# Patient Record
Sex: Male | Born: 1980 | Race: White | Hispanic: No | Marital: Married | Smoking: Never smoker
Health system: Southern US, Community
[De-identification: ages and names within clinical notes are randomized; demographics above are authoritative.]

## PROBLEM LIST (undated history)

## (undated) DIAGNOSIS — M109 Gout, unspecified: Secondary | ICD-10-CM

## (undated) DIAGNOSIS — F5104 Psychophysiologic insomnia: Secondary | ICD-10-CM

## (undated) DIAGNOSIS — E785 Hyperlipidemia, unspecified: Secondary | ICD-10-CM

## (undated) DIAGNOSIS — F329 Major depressive disorder, single episode, unspecified: Secondary | ICD-10-CM

## (undated) DIAGNOSIS — E669 Obesity, unspecified: Secondary | ICD-10-CM

## (undated) DIAGNOSIS — K219 Gastro-esophageal reflux disease without esophagitis: Secondary | ICD-10-CM

## (undated) DIAGNOSIS — I10 Essential (primary) hypertension: Secondary | ICD-10-CM

## (undated) DIAGNOSIS — R51 Headache: Secondary | ICD-10-CM

## (undated) DIAGNOSIS — F32A Depression, unspecified: Secondary | ICD-10-CM

## (undated) DIAGNOSIS — R569 Unspecified convulsions: Secondary | ICD-10-CM

## (undated) DIAGNOSIS — F419 Anxiety disorder, unspecified: Secondary | ICD-10-CM

## (undated) DIAGNOSIS — F1910 Other psychoactive substance abuse, uncomplicated: Secondary | ICD-10-CM

## (undated) HISTORY — DX: Other psychoactive substance abuse, uncomplicated: F19.10

## (undated) HISTORY — PX: FRACTURE SURGERY: SHX138

## (undated) HISTORY — DX: Obesity, unspecified: E66.9

## (undated) HISTORY — DX: Gout, unspecified: M10.9

## (undated) HISTORY — DX: Psychophysiologic insomnia: F51.04

## (undated) HISTORY — PX: OTHER SURGICAL HISTORY: SHX169

## (undated) HISTORY — DX: Hyperlipidemia, unspecified: E78.5

## (undated) HISTORY — DX: Headache: R51

---

## 2011-03-03 ENCOUNTER — Other Ambulatory Visit: Payer: Self-pay | Admitting: Orthopedic Surgery

## 2011-03-03 DIAGNOSIS — M2530 Other instability, unspecified joint: Secondary | ICD-10-CM

## 2011-03-03 DIAGNOSIS — R531 Weakness: Secondary | ICD-10-CM

## 2011-03-03 DIAGNOSIS — R52 Pain, unspecified: Secondary | ICD-10-CM

## 2011-03-11 ENCOUNTER — Ambulatory Visit
Admission: RE | Admit: 2011-03-11 | Discharge: 2011-03-11 | Disposition: A | Discharge status: Home / Self Care | Payer: 59 | Source: Ambulatory Visit | Attending: Orthopedic Surgery | Admitting: Orthopedic Surgery

## 2011-03-11 DIAGNOSIS — M2530 Other instability, unspecified joint: Secondary | ICD-10-CM

## 2011-03-11 DIAGNOSIS — R52 Pain, unspecified: Secondary | ICD-10-CM

## 2011-03-11 DIAGNOSIS — R531 Weakness: Secondary | ICD-10-CM

## 2011-03-11 MED ORDER — METHYLPREDNISOLONE ACETATE 40 MG/ML INJ SUSP (RADIOLOG: 120.0000 mg | Freq: Once | INTRAMUSCULAR | Status: DC

## 2011-03-11 MED ORDER — IOHEXOL 180 MG/ML  SOLN
15.0000 mL | Freq: Once | INTRAMUSCULAR | Status: AC | PRN
  Administered 2011-03-11: 15 mL via INTRA_ARTICULAR

## 2011-03-11 MED ORDER — IOHEXOL 180 MG/ML  SOLN: 1.0000 mL | Freq: Once | INTRAMUSCULAR | Status: AC | PRN

## 2011-03-19 ENCOUNTER — Emergency Department (HOSPITAL_COMMUNITY)
Admission: EM | Admit: 2011-03-19 | Discharge: 2011-03-19 | Disposition: A | Discharge status: Home / Self Care | Payer: 59 | Attending: Emergency Medicine | Admitting: Emergency Medicine

## 2011-03-19 ENCOUNTER — Emergency Department (HOSPITAL_COMMUNITY): Payer: 59

## 2011-03-19 DIAGNOSIS — M25519 Pain in unspecified shoulder: Secondary | ICD-10-CM | POA: Insufficient documentation

## 2011-03-19 DIAGNOSIS — IMO0002 Reserved for concepts with insufficient information to code with codable children: Secondary | ICD-10-CM | POA: Insufficient documentation

## 2011-03-19 DIAGNOSIS — G40909 Epilepsy, unspecified, not intractable, without status epilepticus: Secondary | ICD-10-CM | POA: Insufficient documentation

## 2011-03-19 DIAGNOSIS — X500XXA Overexertion from strenuous movement or load, initial encounter: Secondary | ICD-10-CM | POA: Insufficient documentation

## 2011-04-13 ENCOUNTER — Emergency Department (HOSPITAL_COMMUNITY)
Admission: EM | Admit: 2011-04-13 | Discharge: 2011-04-13 | Disposition: A | Discharge status: Home / Self Care | Payer: 59 | Attending: Emergency Medicine | Admitting: Emergency Medicine

## 2011-04-13 DIAGNOSIS — S43006A Unspecified dislocation of unspecified shoulder joint, initial encounter: Secondary | ICD-10-CM | POA: Insufficient documentation

## 2011-04-13 DIAGNOSIS — G40909 Epilepsy, unspecified, not intractable, without status epilepticus: Secondary | ICD-10-CM | POA: Insufficient documentation

## 2011-04-13 DIAGNOSIS — X58XXXA Exposure to other specified factors, initial encounter: Secondary | ICD-10-CM | POA: Insufficient documentation

## 2011-04-13 DIAGNOSIS — R000 Tachycardia, unspecified: Secondary | ICD-10-CM | POA: Insufficient documentation

## 2011-05-12 ENCOUNTER — Other Ambulatory Visit: Payer: Self-pay | Admitting: Orthopedic Surgery

## 2011-05-12 DIAGNOSIS — M25511 Pain in right shoulder: Secondary | ICD-10-CM

## 2011-05-14 ENCOUNTER — Other Ambulatory Visit: Payer: 59

## 2011-06-03 ENCOUNTER — Ambulatory Visit
Admission: RE | Admit: 2011-06-03 | Discharge: 2011-06-03 | Disposition: A | Discharge status: Home / Self Care | Payer: 59 | Source: Ambulatory Visit | Attending: Orthopedic Surgery | Admitting: Orthopedic Surgery

## 2011-06-03 DIAGNOSIS — M25511 Pain in right shoulder: Secondary | ICD-10-CM

## 2011-07-16 ENCOUNTER — Encounter (HOSPITAL_BASED_OUTPATIENT_CLINIC_OR_DEPARTMENT_OTHER): Payer: Self-pay | Admitting: *Deleted

## 2011-07-16 NOTE — Progress Notes (Signed)
Pack an overnight bag just in case he needs to spend the night for pain and bring all medications.

## 2011-07-22 ENCOUNTER — Ambulatory Visit (HOSPITAL_BASED_OUTPATIENT_CLINIC_OR_DEPARTMENT_OTHER)
Admission: RE | Admit: 2011-07-22 | Discharge: 2011-07-23 | Disposition: A | Discharge status: Home / Self Care | Payer: 59 | Source: Ambulatory Visit | Attending: Orthopedic Surgery | Admitting: Orthopedic Surgery

## 2011-07-22 ENCOUNTER — Ambulatory Visit (HOSPITAL_BASED_OUTPATIENT_CLINIC_OR_DEPARTMENT_OTHER): Payer: 59 | Admitting: Anesthesiology

## 2011-07-22 ENCOUNTER — Ambulatory Visit (HOSPITAL_COMMUNITY): Payer: 59

## 2011-07-22 ENCOUNTER — Encounter (HOSPITAL_BASED_OUTPATIENT_CLINIC_OR_DEPARTMENT_OTHER): Payer: Self-pay | Admitting: Anesthesiology

## 2011-07-22 ENCOUNTER — Encounter (HOSPITAL_BASED_OUTPATIENT_CLINIC_OR_DEPARTMENT_OTHER)
Admission: RE | Disposition: A | Discharge status: Home / Self Care | Payer: Self-pay | Source: Ambulatory Visit | Attending: Orthopedic Surgery

## 2011-07-22 ENCOUNTER — Encounter (HOSPITAL_BASED_OUTPATIENT_CLINIC_OR_DEPARTMENT_OTHER): Payer: Self-pay

## 2011-07-22 DIAGNOSIS — S43499A Other sprain of unspecified shoulder joint, initial encounter: Secondary | ICD-10-CM

## 2011-07-22 DIAGNOSIS — Z472 Encounter for removal of internal fixation device: Secondary | ICD-10-CM | POA: Insufficient documentation

## 2011-07-22 DIAGNOSIS — M01X19 Direct infection of unspecified shoulder in infectious and parasitic diseases classified elsewhere: Secondary | ICD-10-CM | POA: Insufficient documentation

## 2011-07-22 HISTORY — DX: Unspecified convulsions: R56.9

## 2011-07-22 HISTORY — PX: BANKART REPAIR: SHX5173

## 2011-07-22 HISTORY — PX: HARDWARE REMOVAL: SHX979

## 2011-07-22 HISTORY — DX: Anxiety disorder, unspecified: F41.9

## 2011-07-22 HISTORY — DX: Major depressive disorder, single episode, unspecified: F32.9

## 2011-07-22 HISTORY — DX: Depression, unspecified: F32.A

## 2011-07-22 LAB — POCT HEMOGLOBIN-HEMACUE: Hemoglobin: 17 g/dL (ref 13.0–17.0)

## 2011-07-22 SURGERY — REPAIR, SHOULDER, OPEN, BANKART
Anesthesia: Regional | Site: Shoulder | Laterality: Right | Wound class: Clean

## 2011-07-22 MED ORDER — CLINDAMYCIN PHOSPHATE 600 MG/50ML IV SOLN
600.0000 mg | Freq: Four times a day (QID) | INTRAVENOUS | Status: DC
  Administered 2011-07-22 – 2011-07-23 (×2): 600 mg via INTRAVENOUS

## 2011-07-22 MED ORDER — METOCLOPRAMIDE HCL 5 MG PO TABS: 5.0000 mg | ORAL_TABLET | Freq: Three times a day (TID) | ORAL | Status: DC | PRN

## 2011-07-22 MED ORDER — METHOCARBAMOL 500 MG PO TABS
500.0000 mg | ORAL_TABLET | Freq: Four times a day (QID) | ORAL | Status: DC | PRN
  Administered 2011-07-22 – 2011-07-23 (×2): 500 mg via ORAL

## 2011-07-22 MED ORDER — HYDROMORPHONE HCL PF 1 MG/ML IJ SOLN
0.2500 mg | INTRAMUSCULAR | Status: DC | PRN
  Administered 2011-07-22 (×4): 0.5 mg via INTRAVENOUS

## 2011-07-22 MED ORDER — METOCLOPRAMIDE HCL 5 MG/ML IJ SOLN: 5.0000 mg | Freq: Three times a day (TID) | INTRAMUSCULAR | Status: DC | PRN

## 2011-07-22 MED ORDER — MORPHINE SULFATE 2 MG/ML IJ SOLN
2.0000 mg | INTRAMUSCULAR | Status: DC | PRN
  Administered 2011-07-22 – 2011-07-23 (×2): 2 mg via INTRAVENOUS

## 2011-07-22 MED ORDER — PROPOFOL 10 MG/ML IV BOLUS
INTRAVENOUS | Status: DC | PRN
  Administered 2011-07-22: 200 mg via INTRAVENOUS

## 2011-07-22 MED ORDER — FENTANYL CITRATE 0.05 MG/ML IJ SOLN
INTRAMUSCULAR | Status: DC | PRN
  Administered 2011-07-22: 50 ug via INTRAVENOUS
  Administered 2011-07-22: 100 ug via INTRAVENOUS
  Administered 2011-07-22 (×4): 25 ug via INTRAVENOUS
  Administered 2011-07-22: 50 ug via INTRAVENOUS

## 2011-07-22 MED ORDER — MENTHOL 3 MG MT LOZG: 1.0000 | LOZENGE | OROMUCOSAL | Status: DC | PRN

## 2011-07-22 MED ORDER — MEPERIDINE HCL 25 MG/ML IJ SOLN: 6.2500 mg | INTRAMUSCULAR | Status: DC | PRN

## 2011-07-22 MED ORDER — MIDAZOLAM HCL 2 MG/2ML IJ SOLN
1.0000 mg | INTRAMUSCULAR | Status: DC | PRN
  Administered 2011-07-22: 2 mg via INTRAVENOUS

## 2011-07-22 MED ORDER — ONDANSETRON HCL 4 MG PO TABS: 4.0000 mg | ORAL_TABLET | Freq: Four times a day (QID) | ORAL | Status: DC | PRN

## 2011-07-22 MED ORDER — PHENOL 1.4 % MT LIQD: 1.0000 | OROMUCOSAL | Status: DC | PRN

## 2011-07-22 MED ORDER — METHOCARBAMOL 100 MG/ML IJ SOLN: 500.0000 mg | Freq: Four times a day (QID) | INTRAVENOUS | Status: DC | PRN

## 2011-07-22 MED ORDER — DOCUSATE SODIUM 100 MG PO CAPS: 100.0000 mg | ORAL_CAPSULE | Freq: Two times a day (BID) | ORAL | Status: DC

## 2011-07-22 MED ORDER — ONDANSETRON HCL 4 MG/2ML IJ SOLN: 4.0000 mg | Freq: Four times a day (QID) | INTRAMUSCULAR | Status: DC | PRN

## 2011-07-22 MED ORDER — DROPERIDOL 2.5 MG/ML IJ SOLN
INTRAMUSCULAR | Status: DC | PRN
  Administered 2011-07-22: 0.625 mg via INTRAVENOUS

## 2011-07-22 MED ORDER — ACETAMINOPHEN 650 MG RE SUPP: 650.0000 mg | Freq: Four times a day (QID) | RECTAL | Status: DC | PRN

## 2011-07-22 MED ORDER — ONDANSETRON HCL 4 MG/2ML IJ SOLN: 4.0000 mg | Freq: Once | INTRAMUSCULAR | Status: AC | PRN

## 2011-07-22 MED ORDER — FENTANYL CITRATE 0.05 MG/ML IJ SOLN
50.0000 ug | INTRAMUSCULAR | Status: DC | PRN
  Administered 2011-07-22: 100 ug via INTRAVENOUS

## 2011-07-22 MED ORDER — ZOLPIDEM TARTRATE 10 MG PO TABS
10.0000 mg | ORAL_TABLET | Freq: Every evening | ORAL | Status: DC | PRN
  Administered 2011-07-22: 10 mg via ORAL

## 2011-07-22 MED ORDER — LAMOTRIGINE 200 MG PO TABS: 200.0000 mg | ORAL_TABLET | Freq: Every day | ORAL | Status: DC

## 2011-07-22 MED ORDER — MIDAZOLAM HCL 5 MG/5ML IJ SOLN
INTRAMUSCULAR | Status: DC | PRN
  Administered 2011-07-22: 2 mg via INTRAVENOUS

## 2011-07-22 MED ORDER — DIPHENHYDRAMINE HCL 12.5 MG/5ML PO ELIX: 12.5000 mg | ORAL_SOLUTION | ORAL | Status: DC | PRN

## 2011-07-22 MED ORDER — CLINDAMYCIN PHOSPHATE 600 MG/50ML IV SOLN
INTRAVENOUS | Status: DC | PRN
  Administered 2011-07-22: 900 mg via INTRAVENOUS

## 2011-07-22 MED ORDER — DEXAMETHASONE SODIUM PHOSPHATE 10 MG/ML IJ SOLN
INTRAMUSCULAR | Status: DC | PRN
  Administered 2011-07-22: 10 mg via INTRAVENOUS

## 2011-07-22 MED ORDER — ROCURONIUM BROMIDE 100 MG/10ML IV SOLN
INTRAVENOUS | Status: DC | PRN
  Administered 2011-07-22: 40 mg via INTRAVENOUS

## 2011-07-22 MED ORDER — KCL IN DEXTROSE-NACL 20-5-0.45 MEQ/L-%-% IV SOLN
INTRAVENOUS | Status: DC
  Administered 2011-07-22 – 2011-07-23 (×4): via INTRAVENOUS

## 2011-07-22 MED ORDER — FLUOXETINE HCL 20 MG PO CAPS: 20.0000 mg | ORAL_CAPSULE | Freq: Every day | ORAL | Status: DC

## 2011-07-22 MED ORDER — LACTATED RINGERS IV SOLN
INTRAVENOUS | Status: DC
  Administered 2011-07-22 (×2): via INTRAVENOUS

## 2011-07-22 MED ORDER — MORPHINE SULFATE 2 MG/ML IJ SOLN: 0.0500 mg/kg | INTRAMUSCULAR | Status: DC | PRN

## 2011-07-22 MED ORDER — LIDOCAINE HCL (CARDIAC) 20 MG/ML IV SOLN
INTRAVENOUS | Status: DC | PRN
  Administered 2011-07-22: 75 mg via INTRAVENOUS

## 2011-07-22 MED ORDER — ONDANSETRON HCL 4 MG/2ML IJ SOLN
INTRAMUSCULAR | Status: DC | PRN
  Administered 2011-07-22 (×2): 4 mg via INTRAVENOUS

## 2011-07-22 MED ORDER — ACETAMINOPHEN 325 MG PO TABS: 650.0000 mg | ORAL_TABLET | Freq: Four times a day (QID) | ORAL | Status: DC | PRN

## 2011-07-22 MED ORDER — OXYCODONE-ACETAMINOPHEN 5-325 MG PO TABS
1.0000 | ORAL_TABLET | ORAL | Status: DC | PRN
  Administered 2011-07-22 – 2011-07-23 (×4): 2 via ORAL

## 2011-07-22 SURGICAL SUPPLY — 104 items
ANCHOR SUTURETAK 2.4X12.4 (Orthopedic Implant) ×6 IMPLANT
BANDAGE ELASTIC 4 VELCRO ST LF (GAUZE/BANDAGES/DRESSINGS) IMPLANT
BANDAGE ELASTIC 6 VELCRO ST LF (GAUZE/BANDAGES/DRESSINGS) IMPLANT
BANDAGE ESMARK 6X9 LF (GAUZE/BANDAGES/DRESSINGS) IMPLANT
BENZOIN TINCTURE PRP APPL 2/3 (GAUZE/BANDAGES/DRESSINGS) IMPLANT
BLADE AVERAGE 25X9 (BLADE) ×2 IMPLANT
BLADE SURG 15 STRL LF DISP TIS (BLADE) ×2 IMPLANT
BLADE SURG 15 STRL SS (BLADE) ×2
BLADE SURG ROTATE 9660 (MISCELLANEOUS) IMPLANT
BNDG ESMARK 4X9 LF (GAUZE/BANDAGES/DRESSINGS) IMPLANT
BNDG ESMARK 6X9 LF (GAUZE/BANDAGES/DRESSINGS)
CANISTER OMNI JUG 16 LITER (MISCELLANEOUS) ×2 IMPLANT
CANISTER SUCTION 1200CC (MISCELLANEOUS) ×2 IMPLANT
CANISTER SUCTION 2500CC (MISCELLANEOUS) ×2 IMPLANT
CHLORAPREP W/TINT 26ML (MISCELLANEOUS) ×2 IMPLANT
CLOTH BEACON ORANGE TIMEOUT ST (SAFETY) ×2 IMPLANT
COVER TABLE BACK 60X90 (DRAPES) IMPLANT
DECANTER SPIKE VIAL GLASS SM (MISCELLANEOUS) IMPLANT
DISPOSABLES KIT, FOR 2.4MM BIO-SUTURE TAK ×2 IMPLANT
DRAPE C-ARM 42X72 X-RAY (DRAPES) ×2 IMPLANT
DRAPE EXTREMITY T 121X128X90 (DRAPE) IMPLANT
DRAPE INCISE IOBAN 66X45 STRL (DRAPES) ×2 IMPLANT
DRAPE OEC MINIVIEW 54X84 (DRAPES) IMPLANT
DRAPE U 20/CS (DRAPES) ×2 IMPLANT
DRAPE U-SHAPE 47X51 STRL (DRAPES) ×4 IMPLANT
DRAPE U-SHAPE 76X120 STRL (DRAPES) ×4 IMPLANT
DRSG EMULSION OIL 3X3 NADH (GAUZE/BANDAGES/DRESSINGS) ×2 IMPLANT
DRSG PAD ABDOMINAL 8X10 ST (GAUZE/BANDAGES/DRESSINGS) ×4 IMPLANT
ELECT BLADE 6.5 .24CM SHAFT (ELECTRODE) IMPLANT
ELECT REM PT RETURN 9FT ADLT (ELECTROSURGICAL) ×2
ELECTRODE REM PT RTRN 9FT ADLT (ELECTROSURGICAL) ×1 IMPLANT
GAUZE SPONGE 4X4 16PLY XRAY LF (GAUZE/BANDAGES/DRESSINGS) IMPLANT
GLOVE BIO SURGEON STRL SZ 6.5 (GLOVE) ×4 IMPLANT
GLOVE BIO SURGEON STRL SZ7.5 (GLOVE) ×6 IMPLANT
GLOVE BIOGEL PI IND STRL 7.0 (GLOVE) ×2 IMPLANT
GLOVE BIOGEL PI IND STRL 8 (GLOVE) ×2 IMPLANT
GLOVE BIOGEL PI INDICATOR 7.0 (GLOVE) ×2
GLOVE BIOGEL PI INDICATOR 8 (GLOVE) ×2
GLOVE ECLIPSE 7.0 STRL STRAW (GLOVE) ×2 IMPLANT
GLOVE ECLIPSE 7.5 STRL STRAW (GLOVE) ×4 IMPLANT
GOWN PREVENTION PLUS XLARGE (GOWN DISPOSABLE) ×12 IMPLANT
GOWN PREVENTION PLUS XXLARGE (GOWN DISPOSABLE) ×2 IMPLANT
GUIDEWIRE ORTHO 0.054X7 SS (WIRE) ×2 IMPLANT
KIT BIO-TENODESIS 3X8 DISP (MISCELLANEOUS)
KIT INSRT BABSR STRL DISP BTN (MISCELLANEOUS) IMPLANT
NEEDLE HYPO 22GX1.5 SAFETY (NEEDLE) IMPLANT
NEEDLE MAYO 6 CRC TAPER PT (NEEDLE) ×2 IMPLANT
NS IRRIG 1000ML POUR BTL (IV SOLUTION) ×4 IMPLANT
PACK ARTHROSCOPY DSU (CUSTOM PROCEDURE TRAY) ×2 IMPLANT
PACK BASIN DAY SURGERY FS (CUSTOM PROCEDURE TRAY) ×2 IMPLANT
PAD CAST 4YDX4 CTTN HI CHSV (CAST SUPPLIES) IMPLANT
PADDING CAST ABS 4INX4YD NS (CAST SUPPLIES) ×1
PADDING CAST ABS COTTON 4X4 ST (CAST SUPPLIES) ×1 IMPLANT
PADDING CAST COTTON 4X4 STRL (CAST SUPPLIES)
PADDING CAST COTTON 6X4 STRL (CAST SUPPLIES) IMPLANT
PASSER SUT SWANSON 36MM LOOP (INSTRUMENTS) IMPLANT
PENCIL BUTTON HOLSTER BLD 10FT (ELECTRODE) ×2 IMPLANT
SCREW ACUTRAK 2 STD 30MM (Screw) ×4 IMPLANT
SHEET MEDIUM DRAPE 40X70 STRL (DRAPES) ×2 IMPLANT
SLEEVE SCD COMPRESS KNEE MED (MISCELLANEOUS) ×2 IMPLANT
SLING ARM FOAM STRAP LRG (SOFTGOODS) IMPLANT
SLING ARM FOAM STRAP MED (SOFTGOODS) IMPLANT
SLING ARM FOAM STRAP XLG (SOFTGOODS) IMPLANT
SLING ARM IMMOBILIZER LRG (SOFTGOODS) ×2 IMPLANT
SLING ARM IMMOBILIZER MED (SOFTGOODS) IMPLANT
SPLINT FAST PLASTER 5X30 (CAST SUPPLIES)
SPLINT PLASTER CAST FAST 5X30 (CAST SUPPLIES) IMPLANT
SPONGE GAUZE 4X4 12PLY (GAUZE/BANDAGES/DRESSINGS) ×2 IMPLANT
SPONGE LAP 18X18 X RAY DECT (DISPOSABLE) ×4 IMPLANT
SPONGE LAP 4X18 X RAY DECT (DISPOSABLE) ×2 IMPLANT
STAPLER VISISTAT (STAPLE) IMPLANT
STAPLER VISISTAT 35W (STAPLE) ×2 IMPLANT
STOCKINETTE 6  STRL (DRAPES)
STOCKINETTE 6 STRL (DRAPES) IMPLANT
STRIP CLOSURE SKIN 1/2X4 (GAUZE/BANDAGES/DRESSINGS) ×2 IMPLANT
STRYKER PERFORMANCE SAGITTAL BLADE ×2 IMPLANT
SUCTION FRAZIER TIP 10 FR DISP (SUCTIONS) ×2 IMPLANT
SUT 2 FIBERLOOP 20 STRT BLUE (SUTURE)
SUT ETHIBOND 2 OS 4 DA (SUTURE) ×2 IMPLANT
SUT ETHILON 4 0 PS 2 18 (SUTURE) IMPLANT
SUT FIBERWIRE #2 38 T-5 BLUE (SUTURE) ×2
SUT MNCRL AB 3-0 PS2 18 (SUTURE) IMPLANT
SUT MNCRL AB 4-0 PS2 18 (SUTURE) ×2 IMPLANT
SUT MON AB 4-0 PC3 18 (SUTURE) IMPLANT
SUT PDS AB 0 CT 36 (SUTURE) IMPLANT
SUT PROLENE 3 0 PS 2 (SUTURE) IMPLANT
SUT VIC AB 0 CT1 18XCR BRD 8 (SUTURE) ×1 IMPLANT
SUT VIC AB 0 CT1 8-18 (SUTURE) ×1
SUT VIC AB 2-0 SH 18 (SUTURE) ×4 IMPLANT
SUT VIC AB 2-0 SH 27 (SUTURE) ×1
SUT VIC AB 2-0 SH 27XBRD (SUTURE) ×1 IMPLANT
SUT VIC AB 3-0 FS2 27 (SUTURE) IMPLANT
SUT VICRYL 4-0 PS2 18IN ABS (SUTURE) ×2 IMPLANT
SUTURE 2 FIBERLOOP 20 STRT BLU (SUTURE) IMPLANT
SUTURE FIBERWR #2 38 T-5 BLUE (SUTURE) ×1 IMPLANT
SYR 20CC LL (SYRINGE) IMPLANT
SYR BULB 3OZ (MISCELLANEOUS) ×2 IMPLANT
TISSUE GRFT HUMERAL HD TO 43MM (Tissue) ×2 IMPLANT
TOWEL OR 17X24 6PK STRL BLUE (TOWEL DISPOSABLE) ×2 IMPLANT
TOWEL OR NON WOVEN STRL DISP B (DISPOSABLE) ×2 IMPLANT
TUBE CONNECTING 20X1/4 (TUBING) ×2 IMPLANT
UNDERPAD 30X30 INCONTINENT (UNDERPADS AND DIAPERS) ×2 IMPLANT
WATER STERILE IRR 1000ML POUR (IV SOLUTION) ×2 IMPLANT
YANKAUER SUCT BULB TIP NO VENT (SUCTIONS) ×2 IMPLANT

## 2011-07-22 NOTE — Anesthesia Procedure Notes (Addendum)
Anesthesia Regional Block:  Interscalene brachial plexus block  Pre-Anesthetic Checklist: ,, timeout performed, Correct Patient, Correct Site, Correct Laterality, Correct Procedure,, site marked, risks and benefits discussed, Surgical consent,  Pre-op evaluation,  At surgeon's request and post-op pain management  Laterality: Right  Prep: chloraprep       Needles:   Needle Type: Echogenic Stimulator Needle     Needle Length: 5cm 5 cm     Additional Needles:  Procedures: ultrasound guided and nerve stimulator Interscalene brachial plexus block  Nerve Stimulator or Paresthesia:  Response: 0.4 mA,   Additional Responses:   Narrative:  Start time: 07/22/2011 12:05 PM End time: 07/22/2011 12:25 PM Injection made incrementally with aspirations every 5 mL. Anesthesiologist: Arta Bruce MD  Additional Notes: Monitors applied. Patient sedated. Sterile prep and drape,hand hygiene and sterile gloves were used. Relevant anatomy identified.Needle position confirmed.Local anesthetic injected incrementally after negative aspiration. Local anesthetic spread visualized around nerve(s). Vascular puncture avoided. No complications. Image printed for medical record.The patient tolerated the procedure well.        Procedure Name: Intubation Date/Time: 07/22/2011 1:26 PM Performed by: Zenia Resides D Pre-anesthesia Checklist: Patient identified, Emergency Drugs available, Suction available, Patient being monitored and Timeout performed Patient Re-evaluated:Patient Re-evaluated prior to inductionOxygen Delivery Method: Circle System Utilized Preoxygenation: Pre-oxygenation with 100% oxygen Intubation Type: IV induction Ventilation: Mask ventilation without difficulty Laryngoscope Size: Mac and 3 Grade View: Grade I Tube type: Oral Tube size: 8.0 mm Number of attempts: 1 Airway Equipment and Method: stylet and oral airway Placement Confirmation: ETT inserted through vocal cords under direct  vision,  positive ETCO2 and breath sounds checked- equal and bilateral Tube secured with: Tape Dental Injury: Teeth and Oropharynx as per pre-operative assessment

## 2011-07-22 NOTE — Transfer of Care (Signed)
Immediate Anesthesia Transfer of Care Note  Patient: Derek Mckinney  Procedure(s) Performed:  OPEN BANKHARDT REPAIR - right shoulder hardware removal, bone grafting of humeral head defect, open bankhardt repair C ARM; HARDWARE REMOVAL  Patient Location: PACU  Anesthesia Type: General and Regional  Level of Consciousness: awake, alert  and oriented  Airway & Oxygen Therapy: Patient Spontanous Breathing and Patient connected to face mask oxygen  Post-op Assessment: Report given to PACU RN and Post -op Vital signs reviewed and stable  Post vital signs: Reviewed and stable Filed Vitals:   07/22/11 1015  BP: 142/92  Pulse: 84  Temp: 36.6 C  Resp: 20    Complications: No apparent anesthesia complications

## 2011-07-22 NOTE — Anesthesia Postprocedure Evaluation (Signed)
Anesthesia Post Note  Patient: Derek Mckinney  Procedure(s) Performed:  OPEN BANKHARDT REPAIR - right shoulder hardware removal, bone grafting of humeral head defect, open bankhardt repair C ARM; HARDWARE REMOVAL  Anesthesia type: General  Patient location: PACU  Post pain: Pain level controlled  Post assessment: Patient's Cardiovascular Status Stable  Last Vitals:  Filed Vitals:   07/22/11 1745  BP: 144/77  Pulse: 108  Temp:   Resp: 18    Post vital signs: Reviewed and stable  Level of consciousness: alert  Complications: No apparent anesthesia complications

## 2011-07-22 NOTE — Anesthesia Preprocedure Evaluation (Signed)
Anesthesia Evaluation  Patient identified by MRN, date of birth, ID band Patient awake    Reviewed: Allergy & Precautions, H&P , NPO status , Patient's Chart, lab work & pertinent test results  Airway Mallampati: I TM Distance: >3 FB Neck ROM: full    Dental   Pulmonary          Cardiovascular     Neuro/Psych    GI/Hepatic   Endo/Other    Renal/GU      Musculoskeletal   Abdominal   Peds  Hematology   Anesthesia Other Findings   Reproductive/Obstetrics                           Anesthesia Physical Anesthesia Plan  ASA: II  Anesthesia Plan: General ETT and Regional   Post-op Pain Management: MAC Combined w/ Regional for Post-op pain   Induction:   Airway Management Planned:   Additional Equipment:   Intra-op Plan:   Post-operative Plan:   Informed Consent: I have reviewed the patients History and Physical, chart, labs and discussed the procedure including the risks, benefits and alternatives for the proposed anesthesia with the patient or authorized representative who has indicated his/her understanding and acceptance.     Plan Discussed with: CRNA and Surgeon  Anesthesia Plan Comments:         Anesthesia Quick Evaluation

## 2011-07-22 NOTE — Brief Op Note (Signed)
07/22/2011  4:59 PM  PATIENT:  Noreene Larsson  31 y.o. 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  POST-OPERATIVE DIAGNOSIS:  same as preop  PROCEDURE:  Procedure(s):  R shoulder: Open allograft posterior humeral head replacement OPEN [employer] REPAIR HARDWARE REMOVAL  SURGEON:  Surgeon(s): [name], MD Eulas Post  PHYSICIAN ASSISTANT: Jiles Harold PA-S  ASSISTANTS:  Teryl Lucy MD  ANESTHESIA:   regional and general  EBL:  Total I/O In: 2050 [I.V.:2050] Out: 225 [Blood:225]  BLOOD ADMINISTERED:none  DRAINS: none   LOCAL MEDICATIONS USED:  NONE  SPECIMEN:  No Specimen  DISPOSITION OF SPECIMEN:  N/A  COUNTS:  YES  TOURNIQUET:  * No tourniquets in log *  DICTATION: .Other Dictation: Dictation Number [id number]  PLAN OF CARE: Admit for overnight observation  PATIENT DISPOSITION:  PACU - hemodynamically stable.   Delay start of Pharmacological VTE agent (>24hrs) due to surgical blood loss or risk of bleeding:  {YES/NO/NOT APPLICABLE:20182

## 2011-07-22 NOTE — Progress Notes (Signed)
Assisted Dr. Ossey with right, ultrasound guided, supraclavicular block. Side rails up, monitors on throughout procedure. See vital signs in flow sheet. Tolerated Procedure well. 

## 2011-07-22 NOTE — H&P (Signed)
Derek Mckinney is an 31 y.o. male.   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  Past Medical History  Diagnosis Date  . Seizures   . Depression   . Anxiety     Past Surgical History  Procedure Date  . Right shoulder surgery     2012  . Widom teeth extraction      as teenager    History reviewed. No pertinent family history. Social History:  reports that he has never smoked. He does not have any smokeless tobacco history on file. He reports that he does not drink alcohol or use illicit drugs.  Allergies:  Allergies  Allergen Reactions  . Penicillins Swelling    No current facility-administered medications on file as of 07/22/2011.   Medications Prior to Admission  Medication Sig Dispense Refill  . FLUoxetine (PROZAC) 20 MG capsule Take 20 mg by mouth daily.      Marland Kitchen HYDROcodone-acetaminophen (NORCO) 7.5-325 MG per tablet Take 1 tablet by mouth 3 (three) times daily.      Marland Kitchen lamoTRIgine (LAMICTAL) 200 MG tablet Take 200 mg by mouth daily.      . Multiple Vitamin (MULTIVITAMIN) tablet Take 1 tablet by mouth daily.      Marland Kitchen zolpidem (AMBIEN) 10 MG tablet Take 10 mg by mouth at bedtime as needed.        No results found for this or any previous visit (from the past 48 hour(s)). No results found.  Review of Systems  All other systems reviewed and are negative.    Height 6\' 2"  (1.88 m), weight 122.471 kg (270 lb). Physical Exam  Constitutional: He is oriented to person, place, and time. He appears well-developed and well-nourished.  HENT:  Head: Atraumatic.  Eyes: EOM are normal.  Cardiovascular: Intact distal pulses.   Respiratory: Effort normal.  Musculoskeletal:       Right shoulder: He exhibits decreased range of motion and pain. He exhibits normal pulse.  Neurological: He is alert and oriented to person, place, and time.  Skin: Skin is warm  and dry.  Psychiatric: He has a normal mood and affect.     Assessment/Plan R shoulder pain instability after open reduction.  Plan for plate removal, bankart repair and allograft for hill sachs defect. Risks / benefits of surgery discussed Consent on chart  NPO for OR Preop antibiotics   Derek Mckinney WILLIAM 07/22/2011, 10:02 AM

## 2011-07-23 MED ORDER — OXYCODONE-ACETAMINOPHEN 5-325 MG PO TABS: 1.0000 | ORAL_TABLET | ORAL | Status: AC | PRN

## 2011-07-23 NOTE — Op Note (Signed)
NAMEJAMES, SENN               ACCOUNT NO.:  [id number]  MEDICAL RECORD NO.:  [id number]  LOCATION:                                 FACILITY:  PHYSICIAN:  Jones Broom, MD    DATE OF BIRTH:  01-12-1981  DATE OF PROCEDURE:  07/22/2011 DATE OF DISCHARGE:                              OPERATIVE REPORT   PREOPERATIVE DIAGNOSES: 1. 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 2. Right shoulder Bankart labral tear. 3. Retained locking plate, right shoulder.  POSTOPERATIVE DIAGNOSES: 1. 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 2. Right shoulder Bankart labral tear. 3. Retained locking plate, right shoulder.  PROCEDURE PERFORMED: 1. Right shoulder allograft replacement of posterior humeral head Hill-     Sachs defect. 2. Right shoulder open Bankart repair. 3. Right shoulder, removal of locking plate.  ATTENDING SURGEON:  Jones Broom, MD  ASSISTANT:  Eulas Post, MD (Dr. Shelba Flake assistance was imperative for exposure and positioning during this procedure.  PHYSICIAN ASSISTANT:  Jiles Harold, PA-S.  ANESTHESIA:  GETA with preoperative interscalene block.  COMPLICATIONS:  None.  DRAINS:  None.  SPECIMENS:  None.  ESTIMATED BLOOD LOSS:  200 mL.  INDICATION FOR SURGERY:  The patient is a 31 year old gentleman who had a seizure approximately 1 year ago and suffered a right shoulder fracture dislocation.  It was apparently missed for about a month and then he went on to have open reduction and internal fixation.  He has gone on to have chronic pain and instability in the shoulder.  He is unable to keep the shoulder in the socket, it frequently goes out including in his sleep.  He had x-rays and CT scan which demonstrated a very large Hill-Sachs deformity and an MRI which showed the rotator cuff to be intact, but an anterior inferior labral tear.  Given the large Hill-Sachs defect and no associated glenoid  bone loss, he was indicated for open bone grafting of the Hill-Sachs defect as well as an open Bankart repair and removal of the plate and screws.  He understood risks, benefits, and alternatives of the procedure including, but not limited to risk of bleeding, infection, damage to neurovascular structures, including stretch injury.  He understood the inherent risks of allograft use.  He understood the potential risk of recurrent instability.  He elected to go forward with surgery.  OPERATIVE FINDINGS:  His previous incision was used.  The widened scar was excised.  The plate and all screws were removed.  The subscapularis was taken down with a tenotomy.  The tendon was noted to be very attenuated.  The Hill-Sachs was exposed and an allograft humeral head which was matched in size was used to replace the posterior humeral head defect.  Two Acutrak screws were used to fix this in place.  An open Bankart procedure was carried out with three 2.4-mm PEEK suture tack anchors.  The shoulder was stable at the conclusion of the procedure, and I can bring him out to about 15 degrees external rotation without undue stress on the subscapularis repair.  DESCRIPTION OF PROCEDURE:  The patient was identified in the preoperative holding area where I personally marked the operative  site after verifying site, side, and procedure with the patient.  He had an interscalene block given by the attending anesthesiologist and was then taken back to the operating room where general anesthesia was induced without complication.  He was placed in a beach-chair position with the back elevated about 45 degrees.  Head, neck, and opposite extremity were carefully padded in position.  After the appropriate time-out procedure, the patient did receive appropriate antibiotics.  The right upper extremity was prepped and draped in a standard sterile fashion, and the previous incision was ellipsed out as it had widened  significantly. Dissection was then carried down to the cephalic vein, which was taken laterally with the deltoid.  There was significant amount of deltoid scarring down to the lateral humerus and plate which was carefully meticulously excised, taking care to protect the axillary nerve.  Once the subdeltoid space was completely opened up, the pectoralis major was identified.  The conjoined tendon was identified and carefully protected.  The lateral border of the conjoined was developed and a retractor was placed beneath the conjoined tendon with the lateral portal retractor between the deltoid laterally.  The plate and all screws were then successfully removed.  Attention was then turned to the subscapularis where the tendon was taken down with a tenotomy approximately 1 cm medial to the bicipital groove.  The subscapularis was tagged with an Ethibond suture for later retrieval.  The capsule was taken off the neck of the humerus around to about 6 o'clock for about 1 cm capsular release down on to the proximal humerus for complete exposure that would be required for the Hill-Sachs defect.  With adduction, external rotation, and extension, the humeral head was then dislocated and progressively externally rotated until the forearm was nearly pointing completely backwards to allow adequate exposure of the Hill-Sachs defect.  With progressive external rotation, with patience, I was able to fully expose the Hill-Sachs defect.  An oscillating saw was used to freshen the anterior edge of the defect to create a nice flat surface.  Measurements were then taken of the depth and width of the defect for preparation of the allograft.  Then, on the back table, the allograft was measured and marked and a small oscillating saw was used to cut the appropriate size allograft on the matched allograft humeral head which was thawed.  It was then placed in the defect on the posterior humeral head, and after a  few minor adjustments, was a perfect fit.  The allograft was pinned in place with guide pins from the Acutrak screw set separated by about 2 cm evenly spaced.  They were then drilled and filled with 230-mm Acutrak screws with excellent fixation.  Each screw was countersunk by approximately 1-2 mm.  The guide pins were removed and the joint was reduced.  Fluoroscopic imaging was used to verify appropriate positioning, size of the graft, and positioning and size of the screws.  The joint was then copiously irrigated with normal saline.  The arm was then placed in a sterile arm holder in slight flexion and a Fukuda retractor was placed posteriorly, taking great care not to disrupt the graft.  The anterior glenoid was exposed and noted to have a Bankart tear from approximately 2 o'clock to 6 o'clock position anteriorly.  A Cobb elevator was used to gently elevate the torn labrum. A rongeur was used on the anterior glenoid to debride excess scar tissue.  A small rasp was used to roughen the area to promote  healing. Three 2.4-mm PEEK anchors were then placed under direct visualization with one at the 5 o'clock, 4 o'clock, and 3 o'clock position.  Each suture strand was then sequentially passed inferior to superior, advancing the labrum and anterior-inferior capsule superiorly with each pass.  Using a knot pusher, each was then tied and cut.  The labral repair was felt to be excellent.  The joint was again copiously irrigated with normal saline, and the subscapularis was then repaired tendon to tendon with #2 FiberWire in a figure-of-eight fashion with 3 sutures.  An adequate repair was noted.  The tissue was noted to be thin, but held suture nicely and the repair felt pretty good at the end. One interval closure stitch was made as well with a figure-of-eight suture at the corner of the interval to backup the repair.  The wound was then again copiously irrigated with normal saline and  subsequently closed in layers with 2-0 Vicryl in deep dermal layer, and staples for skin closure.  The operative field was dry and drain was not felt necessary.  Sterile dressings were then applied including Adaptic, 4x4s, ABDs, and tape.  The patient was placed in a sling immobilizer, allowed to awaken from general anesthesia, transferred to the stretcher, and taken to the recovery room in stable condition.  POSTOPERATIVE PLAN:  He will be kept overnight for pain control and antibiotics.  He will be discharged in the morning.  He will remain in a sling until his followup in approximately 2 weeks for wound check and staple removal.  We will get x-rays at that time.     Jones Broom, MD     JC/MEDQ  D:  07/22/2011  T:  07/23/2011  Job:  [id number]

## 2011-07-23 NOTE — Progress Notes (Signed)
PATIENT ID: Derek Mckinney  MRN: [id number]  DOB/AGE:  [birth year] / 31 y.o.  1 Day Post-Op Procedure(s) (LRB): OPEN Derek Mckinney REPAIR (Right) HARDWARE REMOVAL (Right)  Subjective: Pain is moderate and improving.  No c/o chest pain or SOB.   Well controlled with percocet.   Objective: Vital signs in last 24 hours: Temp:  [97.6 F (36.4 C)-98.7 F (37.1 C)] 98.5 F (36.9 C) (01/15 2045) Pulse Rate:  [84-115] 100  (01/16 0500) Resp:  [16-20] 16  (01/16 0500) BP: (132-144)/(77-97) 138/83 mmHg (01/15 2045) SpO2:  [93 %-99 %] 98 % (01/16 0500)  Intake/Output from previous day: 01/15 0701 - 01/16 0700 In: 4764.4 [P.O.:1604; I.V.:3160.4] Out: 2025 [Urine:1800; Blood:225] Intake/Output this shift: Total I/O In: 2292.4 [P.O.:1182; I.V.:1110.4] Out: 1800 [Urine:1800]   Basename 07/22/11 1231  HGB 17.0   No results found for this basename: WBC:2,RBC:2,HCT:2,PLT:2 in the last 72 hours No results found for this basename: NA:2,K:2,CL:2,CO2:2,BUN:2,CREATININE:2,GLUCOSE:2,CALCIUM:2 in the last 72 hours No results found for this basename: LABPT:2,INR:2 in the last 72 hours  Physical Exam: R shoulder: Neurovascular intact Sensation intact distally Intact pulses distally Incision: dressing C/D/I  Assessment/Plan: 1 Day Post-Op Procedure(s) (LRB): OPEN BANKHARDT REPAIR (Right) HARDWARE REMOVAL (Right)   D/C IV fluids Non Weight Bearing (NWB)  D/c home today.  F/u 10-14 days. Sling at all times except bathing and dressing.  Gentle H/W/E motion.   Derek Mckinney 07/23/2011, 6:50 AM

## 2011-08-01 ENCOUNTER — Encounter (HOSPITAL_BASED_OUTPATIENT_CLINIC_OR_DEPARTMENT_OTHER): Payer: Self-pay | Admitting: Orthopedic Surgery

## 2011-08-14 ENCOUNTER — Emergency Department (HOSPITAL_COMMUNITY): Payer: 59

## 2011-08-14 ENCOUNTER — Emergency Department (HOSPITAL_COMMUNITY)
Admission: EM | Admit: 2011-08-14 | Discharge: 2011-08-14 | Disposition: A | Discharge status: Home / Self Care | Payer: 59 | Attending: Emergency Medicine | Admitting: Emergency Medicine

## 2011-08-14 ENCOUNTER — Encounter (HOSPITAL_COMMUNITY): Payer: Self-pay | Admitting: *Deleted

## 2011-08-14 DIAGNOSIS — F341 Dysthymic disorder: Secondary | ICD-10-CM | POA: Insufficient documentation

## 2011-08-14 DIAGNOSIS — R569 Unspecified convulsions: Secondary | ICD-10-CM | POA: Insufficient documentation

## 2011-08-14 DIAGNOSIS — Z79899 Other long term (current) drug therapy: Secondary | ICD-10-CM | POA: Insufficient documentation

## 2011-08-14 DIAGNOSIS — F29 Unspecified psychosis not due to a substance or known physiological condition: Secondary | ICD-10-CM | POA: Insufficient documentation

## 2011-08-14 DIAGNOSIS — M25519 Pain in unspecified shoulder: Secondary | ICD-10-CM | POA: Insufficient documentation

## 2011-08-14 LAB — POCT I-STAT, CHEM 8
BUN: 10 mg/dL (ref 6–23)
Calcium, Ion: 1.18 mmol/L (ref 1.12–1.32)
Chloride: 108 mEq/L (ref 96–112)
Creatinine, Ser: 0.7 mg/dL (ref 0.50–1.35)
Glucose, Bld: 102 mg/dL — ABNORMAL HIGH (ref 70–99)
HCT: 43 % (ref 39.0–52.0)
Hemoglobin: 14.6 g/dL (ref 13.0–17.0)
Potassium: 4 mEq/L (ref 3.5–5.1)
Sodium: 141 mEq/L (ref 135–145)
TCO2: 23 mmol/L (ref 0–100)

## 2011-08-14 LAB — VALPROIC ACID LEVEL: Valproic Acid Lvl: <10 ug/mL — ABNORMAL LOW (ref 50.0–100.0)

## 2011-08-14 MED ORDER — MORPHINE SULFATE 4 MG/ML IJ SOLN
4.0000 mg | Freq: Once | INTRAMUSCULAR | Status: AC
  Administered 2011-08-14: 4 mg via INTRAVENOUS
  Filled 2011-08-14: qty 1

## 2011-08-14 MED ORDER — HYDROMORPHONE HCL PF 1 MG/ML IJ SOLN
1.0000 mg | Freq: Once | INTRAMUSCULAR | Status: AC
  Administered 2011-08-14: 1 mg via INTRAVENOUS
  Filled 2011-08-14: qty 1

## 2011-08-14 MED ORDER — ONDANSETRON HCL 4 MG/2ML IJ SOLN
4.0000 mg | Freq: Once | INTRAMUSCULAR | Status: AC
  Administered 2011-08-14: 4 mg via INTRAVENOUS
  Filled 2011-08-14: qty 2

## 2011-08-14 MED ORDER — VALPROATE SODIUM 500 MG/5ML IV SOLN
500.0000 mg | Freq: Once | INTRAVENOUS | Status: AC
  Administered 2011-08-14: 500 mg via INTRAVENOUS
  Filled 2011-08-14: qty 5

## 2011-08-14 NOTE — ED Provider Notes (Signed)
History     CSN: [id number]  Arrival date & time 08/14/11  1333   First MD Initiated Contact with Patient 08/14/11 1353      Chief Complaint  Patient presents with  . Seizures    (Consider location/radiation/quality/duration/timing/severity/associated sxs/prior treatment) HPI Comments: Mr. Mogel presents to the ED via EMS following an unwitnessed seizure which occurred at work. Mr. Andrepont has a history of seizures since 2006. He takes depakote for this. He missed his dose this morning, but has been taking it regularly for years. Mr. Broadus did not sleep well last night and reports that lack of sleep is a trigger for his seizures. He was seeing a neurologist in [place], but moved to [place] this past summer and has not sought out a new neurologist. He is interested in getting a referral for a neurologist today. Denies alcohol use, head injury. Recently increased dose of Prozac. Mr. Fernandez complains of bilateral shoulder pain, the right is worse than the left. He had surgery on his right shoulder on Jan 15th for recurrent dislocation and Bankhart lesion.   Patient is a 31 y.o. male presenting with seizures. The history is provided by the patient.  Seizures  This is a recurrent problem. The current episode started 1 to 2 hours ago. The problem has been resolved. There was 1 seizure. Duration: unknown, unwitnessed. Associated symptoms include confusion. Pertinent negatives include no headaches, no visual disturbance, no neck stiffness, no sore throat, no chest pain, no cough, no nausea, no vomiting and no diarrhea. Characteristics do not include bowel incontinence or bit tongue. The episode was not witnessed. There was the sensation of an aura present. The seizures did not continue in the ED. Possible causes include sleep deprivation. There has been no fever. There were no medications administered prior to arrival.    Past Medical History  Diagnosis Date  . Seizures   . Depression   . Anxiety      Past Surgical History  Procedure Date  . Right shoulder surgery     2012  . Widom teeth extraction      as teenager  . Bankhardt repair 07/22/2011    Procedure: OPEN [name] REPAIR;  Surgeon: [name], MD;  Location: [facility];  Service: Orthopedics;  Laterality: Right;  right shoulder hardware removal, bone grafting of humeral head defect, open bankhardt repair C ARM  . Hardware removal 07/22/2011    Procedure: HARDWARE REMOVAL;  Surgeon: [name], MD;  Location: [facility];  Service: Orthopedics;  Laterality: Right;    History reviewed. No pertinent family history.  History  Substance Use Topics  . Smoking status: Never Smoker   . Smokeless tobacco: Not on file  . Alcohol Use: No      Review of Systems  Constitutional: Negative for fever and fatigue.  HENT: Negative for sore throat, rhinorrhea, neck pain and tinnitus.   Eyes: Negative for photophobia, pain, redness and visual disturbance.  Respiratory: Negative for cough and shortness of breath.   Cardiovascular: Negative for chest pain.  Gastrointestinal: Negative for nausea, vomiting, abdominal pain, diarrhea and bowel incontinence.  Genitourinary: Negative for dysuria.  Musculoskeletal: Negative for myalgias, back pain and gait problem.  Skin: Negative for rash and wound.  Neurological: Positive for seizures. Negative for dizziness, weakness, light-headedness, numbness and headaches.  Psychiatric/Behavioral: Positive for confusion. Negative for decreased concentration.    Allergies  Penicillins  Home Medications   Current Outpatient Rx  Name Route Sig Dispense  Refill  . ACETAMINOPHEN 500 MG PO TABS Oral Take 1,000 mg by mouth every 6 (six) hours as needed. For pain    . DIVALPROEX SODIUM ER 500 MG PO TB24 Oral Take 1,000 mg by mouth daily.    Marland Kitchen FLUOXETINE HCL 10 MG PO TABS Oral Take 10 mg by mouth daily.    . IBUPROFEN 200 MG PO TABS Oral Take 800  mg by mouth every 6 (six) hours as needed. For pain    . ONE-DAILY MULTI VITAMINS PO TABS Oral Take 1 tablet by mouth daily.    Marland Kitchen ZOLPIDEM TARTRATE 10 MG PO TABS Oral Take 10 mg by mouth at bedtime as needed. For sleep      BP 155/100  Pulse 106  Temp(Src) 98.2 F (36.8 C) (Oral)  Resp 18  SpO2 96%  Physical Exam  Nursing note and vitals reviewed. Constitutional: He is oriented to person, place, and time. He appears well-developed and well-nourished.  HENT:  Head: Normocephalic and atraumatic. Head is without raccoon's eyes and without Battle's sign.  Right Ear: Tympanic membrane, external ear and ear canal normal. No hemotympanum.  Left Ear: Tympanic membrane, external ear and ear canal normal. No hemotympanum.  Nose: Nose normal.  Mouth/Throat: Oropharynx is clear and moist.       No lateral tongue biting.  Eyes: Conjunctivae, EOM and lids are normal. Pupils are equal, round, and reactive to light.       No visible hyphema  Neck: Normal range of motion. Neck supple.  Cardiovascular: Normal rate and regular rhythm.   Pulmonary/Chest: Effort normal and breath sounds normal.  Abdominal: Soft. There is no tenderness.  Musculoskeletal: Normal range of motion.       Cervical back: He exhibits normal range of motion, no tenderness and no bony tenderness.       Thoracic back: He exhibits no tenderness and no bony tenderness.       Lumbar back: He exhibits no tenderness and no bony tenderness.  Neurological: He is alert and oriented to person, place, and time. He has normal strength and normal reflexes. No cranial nerve deficit or sensory deficit. Coordination normal. GCS eye subscore is 4. GCS verbal subscore is 5. GCS motor subscore is 6.  Skin: Skin is warm and dry.  Psychiatric: He has a normal mood and affect.    ED Course  Procedures (including critical care time)  Labs Reviewed  VALPROIC ACID LEVEL - Abnormal; Notable for the following:    Valproic Acid Lvl <10.0 (*)    All  other components within normal limits  POCT I-STAT, CHEM 8 - Abnormal; Notable for the following:    Glucose, Bld 102 (*)    All other components within normal limits   Dg Shoulder Right  08/14/2011  *RADIOLOGY REPORT*  Clinical Data: Seizure.  Right shoulder injury with pain.  RIGHT SHOULDER - 2+ VIEW  Comparison: Radiographs 07/22/2011.  CT 06/03/2011.  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 Appearance is unchanged.  Two cortical screws within the humeral head are stable in position.  Because of apparent angulation on the Y-view, the humeral head projects over the distal clavicle.  There is no evidence of recurrent anterior or posterior glenohumeral dislocation.  IMPRESSION: Postsurgical changes with chronic deformity of the humeral head. No recurrent dislocation identified.  Original Report Authenticated By: Gerrianne Scale, M.D.     1. Seizure     3:55  PM Patient seen and examined. Work-up initiated. Medications ordered.   Vital signs reviewed and are as follows: BP 155/100  Pulse 99  Temp(Src) 98.2 F (36.8 C) (Oral)  Resp 18  SpO2 100%  Patient was re-examined. Informed of x-ray results. Depakote level pending. Additional pain medicine ordered.   8:38 PM Depakote given. Neurology referral given. Patient informed that he cannot drive for the next 6 months. Told to see neurology for management of his medications. Urged followup with his orthopedic doctor if no improvement of shoulder pain.   MDM  Patient with history of seizure disorder, subtherapeutic Depakote. Depakote given in emergency department. No alcohol use or head injury. Patient otherwise appears well and is stable for discharge home.        Eustace Moore [place], [place] 08/14/11 2044

## 2011-08-14 NOTE — ED Notes (Addendum)
Physician assistant student at bedside

## 2011-08-14 NOTE — ED Notes (Signed)
Pt alert and oriented x4. Respirations even and unlabored, bilateral symmetrical rise and fall of chest. Skin warm and dry. In no acute distress. Denies needs. Pt reports scratch on right wrist. Reports n/v

## 2011-08-14 NOTE — ED Notes (Signed)
Patient stable upon discharge.  

## 2011-08-14 NOTE — ED Notes (Signed)
Pa by bedside

## 2011-08-14 NOTE — ED Notes (Signed)
[id number]<AV> Expected date:08/14/11<BR> Expected time: 1:25 PM<BR> Means of arrival:Ambulance<BR> Comments:<BR> EMS 41 GC, 30 yom seizure with history

## 2011-08-14 NOTE — ED Notes (Addendum)
Pt in s/p un witnessed seizure today at work, alert and oriented at this time, c/o bilateral shoulder pain, has surgery on right shoulder one week ago, pt has history of seizures and is not taking his medication at this time. 20g in RAC per EMS, CBG 79. Per family pt tends to have a seizure after not getting enough sleep and patient was awake late last night and up early today, pt stopped taking medication because he had not had a seizure recently.

## 2011-08-15 NOTE — ED Provider Notes (Signed)
Medical screening examination/treatment/procedure(s) were performed by non-physician practitioner and as supervising physician I was immediately available for consultation/collaboration.  Bayley Yarborough T Roselinda Bahena, MD 08/15/11 1113 

## 2011-09-16 ENCOUNTER — Ambulatory Visit (INDEPENDENT_AMBULATORY_CARE_PROVIDER_SITE_OTHER): Payer: 59 | Admitting: Family Medicine

## 2011-09-16 VITALS — BP 114/80 | HR 125 | Temp 98.5°F | Resp 18 | Ht 73.0 in | Wt 265.0 lb

## 2011-09-16 DIAGNOSIS — M25519 Pain in unspecified shoulder: Secondary | ICD-10-CM

## 2011-09-16 DIAGNOSIS — J019 Acute sinusitis, unspecified: Secondary | ICD-10-CM

## 2011-09-16 MED ORDER — AZITHROMYCIN 250 MG PO TABS: ORAL_TABLET | ORAL | Status: AC

## 2011-09-16 MED ORDER — HYDROCODONE-ACETAMINOPHEN 5-500 MG PO TABS: ORAL_TABLET | ORAL | Status: DC

## 2011-09-16 MED ORDER — BENZONATATE 100 MG PO CAPS: ORAL_CAPSULE | ORAL | Status: AC

## 2011-09-16 NOTE — Patient Instructions (Signed)
Drink plenty of fluids Get plenty of rest If not doing better with the medications let me know.

## 2011-09-16 NOTE — Progress Notes (Signed)
Subjective: Patient has a two-week history of congestion cough and headache. He has not been running a fever. He just doesn't feel good. He gets a lot of thick phlegm in his throat which clears out by mid day. His ears get congested some period he is just not been able to throw this infection. He usually can get rid of respiratory tract infections.  Objective: TMs normal. Throat clear. Neck supple without nodes. Chest is clear to auscultation. Heart regular. He has a scar in his right shoulder where he had surgery, with a small skin wound and scar where he is pulling off the skin tagging and things inflamed.  Assessment: Sinusitis Bronchitis Shoulder pain Headache  Plan pain medication Antibiotics Drink plenty of fluids and get enough rest. Will not do any major labs and x-rays today but if he gets worse we may have to check some other things out.  Patient also complains of a lot of shoulder pain. He is going to see his orthopedist in [place] soon. I will give pain medicine today to

## 2011-09-19 ENCOUNTER — Encounter (HOSPITAL_COMMUNITY): Payer: Self-pay | Admitting: Emergency Medicine

## 2011-09-19 ENCOUNTER — Emergency Department (HOSPITAL_COMMUNITY)
Admission: EM | Admit: 2011-09-19 | Discharge: 2011-09-19 | Disposition: A | Discharge status: Home / Self Care | Payer: 59 | Attending: Emergency Medicine | Admitting: Emergency Medicine

## 2011-09-19 ENCOUNTER — Other Ambulatory Visit: Payer: Self-pay

## 2011-09-19 DIAGNOSIS — F341 Dysthymic disorder: Secondary | ICD-10-CM | POA: Insufficient documentation

## 2011-09-19 DIAGNOSIS — R413 Other amnesia: Secondary | ICD-10-CM | POA: Insufficient documentation

## 2011-09-19 DIAGNOSIS — G40909 Epilepsy, unspecified, not intractable, without status epilepticus: Secondary | ICD-10-CM | POA: Insufficient documentation

## 2011-09-19 DIAGNOSIS — S1093XA Contusion of unspecified part of neck, initial encounter: Secondary | ICD-10-CM | POA: Insufficient documentation

## 2011-09-19 DIAGNOSIS — Z79899 Other long term (current) drug therapy: Secondary | ICD-10-CM | POA: Insufficient documentation

## 2011-09-19 DIAGNOSIS — R569 Unspecified convulsions: Secondary | ICD-10-CM

## 2011-09-19 DIAGNOSIS — IMO0002 Reserved for concepts with insufficient information to code with codable children: Secondary | ICD-10-CM | POA: Insufficient documentation

## 2011-09-19 DIAGNOSIS — J019 Acute sinusitis, unspecified: Secondary | ICD-10-CM

## 2011-09-19 DIAGNOSIS — S0003XA Contusion of scalp, initial encounter: Secondary | ICD-10-CM | POA: Insufficient documentation

## 2011-09-19 LAB — BASIC METABOLIC PANEL
BUN: 11 mg/dL (ref 6–23)
CO2: 22 mEq/L (ref 19–32)
Calcium: 9.5 mg/dL (ref 8.4–10.5)
Chloride: 99 mEq/L (ref 96–112)
Creatinine, Ser: 0.82 mg/dL (ref 0.50–1.35)
GFR calc Af Amer: >90 mL/min (ref >90)
GFR calc non Af Amer: >90 mL/min (ref >90)
Glucose, Bld: 115 mg/dL — ABNORMAL HIGH (ref 70–99)
Potassium: 4.1 mEq/L (ref 3.5–5.1)
Sodium: 133 mEq/L — ABNORMAL LOW (ref 135–145)

## 2011-09-19 LAB — VALPROIC ACID LEVEL: Valproic Acid Lvl: <10 ug/mL — ABNORMAL LOW (ref 50.0–100.0)

## 2011-09-19 LAB — MAGNESIUM: Magnesium: 2.1 mg/dL (ref 1.5–2.5)

## 2011-09-19 MED ORDER — SODIUM CHLORIDE 0.9 % IV BOLUS (SEPSIS)
1000.0000 mL | Freq: Once | INTRAVENOUS | Status: AC
  Administered 2011-09-19: 1000 mL via INTRAVENOUS

## 2011-09-19 NOTE — ED Provider Notes (Signed)
History     31-year-old male with seizure-like activity. Patient was at work. Was getting up from a chair when he fell forward and struck his head. Witnessed by coworkers and apparently had generalized tonic-clonic activity. Patient is amnestic to the events. He does have a history of seizure disorder. Was previously on Depakote. Was changed to Keppra about a month ago by his neurologist because patient was unhappy with some of the side effects. His last seizure prior to today was about 4 months ago. Patient has been in his usual state of health for the past couple days. Diffuse or chills. Patient reports compliance with his medications. Denies sleep deprivation. Mild HA. No neck or back pain. No numbness, tingling or loss of strength.  CSN: [id number]  Arrival date & time 09/19/11  1124   First MD Initiated Contact with Patient 09/19/11 1202      Chief Complaint  Patient presents with  . Seizures    ems stated that pt had a withnessed sz at work lasting 1 min fell from chair head first     (Consider location/radiation/quality/duration/timing/severity/associated sxs/prior treatment) HPI  Past Medical History  Diagnosis Date  . Seizures   . Depression   . Anxiety     Past Surgical History  Procedure Date  . Right shoulder surgery     2012  . Widom teeth extraction      as teenager  . Bankhardt repair 07/22/2011    Procedure: OPEN [name] REPAIR;  Surgeon: [name], MD;  Location: [facility];  Service: Orthopedics;  Laterality: Right;  right shoulder hardware removal, bone grafting of humeral head defect, open bankhardt repair C ARM  . Hardware removal 07/22/2011    Procedure: HARDWARE REMOVAL;  Surgeon: [name], MD;  Location: [facility];  Service: Orthopedics;  Laterality: Right;    History reviewed. No pertinent family history.  History  Substance Use Topics  . Smoking status: Never Smoker   . Smokeless tobacco:  Not on file  . Alcohol Use: No      Review of Systems   Review of symptoms negative unless otherwise noted in HPI.   Allergies  Penicillins  Home Medications   Current Outpatient Rx  Name Route Sig Dispense Refill  . ACETAMINOPHEN 500 MG PO TABS Oral Take 1,000 mg by mouth every 6 (six) hours as needed. For pain    . AZITHROMYCIN 250 MG PO TABS  Take 2 initially then 1 daily for 4 days 6 tablet 0  . BENZONATATE 100 MG PO CAPS  Use 1-2 tablets 3 times daily as necessary for cough. May be used with other cough medicines if needed. 30 capsule 0  . DESVENLAFAXINE SUCCINATE ER 50 MG PO TB24 Oral Take 50 mg by mouth daily.    Marland Kitchen HYDROCODONE-ACETAMINOPHEN 5-500 MG PO TABS  Take one or 2 every 8 hours as needed for severe pain only. 30 tablet 0  . IBUPROFEN 200 MG PO TABS Oral Take 800 mg by mouth every 6 (six) hours as needed. For pain    . LEVETIRACETAM 500 MG PO TABS Oral Take 500 mg by mouth 2 (two) times daily.    Marland Kitchen ONE-DAILY MULTI VITAMINS PO TABS Oral Take 1 tablet by mouth daily.    Marland Kitchen ZOLPIDEM TARTRATE 10 MG PO TABS Oral Take 10 mg by mouth at bedtime as needed. For sleep    . DIVALPROEX SODIUM ER 500 MG PO TB24 Oral Take 1,000 mg by mouth  daily.    Marland Kitchen FLUOXETINE HCL 10 MG PO TABS Oral Take 10 mg by mouth daily.      BP 124/72  Pulse 117  Temp 98.4 F (36.9 C)  Resp 20  Ht 6\' 2"  (1.88 m)  Wt 270 lb (122.471 kg)  BMI 34.67 kg/m2  SpO2 98%  Physical Exam  Nursing note and vitals reviewed. Constitutional: He is oriented to person, place, and time. He appears well-developed and well-nourished. No distress.       On spine board with cervical collar in place. No acute distress.  HENT:  Head: Normocephalic and atraumatic.       Small hematoma to the left for head with an overlying superficial abrasion. No singificant bony tenderness. Small abrasion this. No significant bony tenderness. No septal hematoma. No epistaxis.  Eyes: Conjunctivae are normal. Pupils are equal, round,  and reactive to light. Right eye exhibits no discharge. Left eye exhibits no discharge.  Cardiovascular: Normal rate, regular rhythm and normal heart sounds.  Exam reveals no gallop and no friction rub.   No murmur heard. Pulmonary/Chest: Effort normal and breath sounds normal. No respiratory distress.  Abdominal: Soft. He exhibits no distension. There is no tenderness.  Musculoskeletal: He exhibits no edema and no tenderness.       No midline spinal tenderness  Neurological: He is alert and oriented to person, place, and time.  Skin: Skin is warm and dry.  Psychiatric: He has a normal mood and affect. His behavior is normal. Thought content normal.    ED Course  Procedures (including critical care time)  Labs Reviewed  BASIC METABOLIC PANEL - Abnormal; Notable for the following:    Sodium 133 (*)    Glucose, Bld 115 (*)    All other components within normal limits  MAGNESIUM  VALPROIC ACID LEVEL   No results found.  EKG:  Rhythm: Normal sinus rhythm Rate: 88 Axis: Normal asked Intervals: Normal ST segments: Normal   1. Seizure       MDM  31 year old male with seizure. Patient has a known seizure disorder. Workup today pretty unremarkable aside from minimal hyponatremia. Doubt that this is the etiology of patient's symptoms today. Patient is on anticonvulsants, Keppra. Patient reports compliance with his medications. Patient returned to baseline and has remained so through out his emergency department stay. He does have some external signs of trauma, but has a nonfocal neurological examination. He has a mild headache. He is not on anticoagulation. Neuroimaging was considered but deferred at this time. Patient has a neurologist, Dr. Anne Hahn, who he can follow up with. Return precautions were discussed. Outpatient followup.        Raeford Razor, MD 09/19/11 1505

## 2011-09-19 NOTE — Discharge Instructions (Signed)
Driving and Equipment Restrictions Some medical problems make it dangerous to drive, ride a bike, or use machines. Some of these problems are:  A hard blow to the head (concussion).   Passing out (fainting).   Twitching and shaking (seizures).   Low blood sugar.   Taking medicine to help you relax (sedatives).   Taking pain medicines.   Wearing an eye patch.   Wearing splints. This can make it hard to use parts of your body that you need to drive safely.  HOME CARE   Do not drive until your doctor says it is okay.   Do not use machines until your doctor says it is okay.  You may need a form signed by your doctor (medical release) before you can drive again. You may also need this form before you do other tasks where you need to be fully alert. MAKE SURE YOU:  Understand these instructions.   Will watch your condition.   Will get help right away if you are not doing well or get worse.  Document Released: 07/31/2004 Document Revised: 06/12/2011 Document Reviewed: 10/31/2009 [employer] Patient Information 2012 [place], [place].Epilepsy A seizure (convulsion) is a sudden change in brain function that causes a change in behavior, muscle activity, or ability to remain awake and alert. If a person has recurring seizures, this is called epilepsy. CAUSES  Epilepsy is a disorder with many possible causes. Anything that disturbs the normal pattern of brain cell activity can lead to seizures. Seizure can be caused from illness to brain damage to abnormal brain development. Epilepsy may develop because of:  An abnormality in brain wiring.   An imbalance of nerve signaling chemicals (neurotransmitters).   Some combination of these factors.  Scientists are learning an increasing amount about genetic causes of seizures. SYMPTOMS  The symptoms of a seizure can vary greatly from one person to another. These may include:  An aura, or warning that tells a person they are about to have a  seizure.   Abnormal sensations, such as abnormal smell or seeing flashing lights.   Sudden, general body stiffness.   Rhythmic jerking of the face, arm, or leg - on one or both sides.   Sudden change in consciousness.   The person may appear to be awake but not responding.   They may appear to be asleep but cannot be awakened.   Grimacing, chewing, lip smacking, or drooling.   Often there is a period of sleepiness after a seizure.  DIAGNOSIS  The description you give to your caregiver about what you experienced will help them understand your problems. Equally important is the description by any witnesses to your seizure. A physical exam, including a detailed neurological exam, is necessary. An EEG (electroencephalogram) is a painless test of your brain waves. In this test a diagram is created of your brain waves. These diagrams can be interpreted by a specialist. Pictures of your brain are usually taken with:  An MRI.   A CT scan.  Lab tests may be done to look for:  Signs of infection.   Abnormal blood chemistry.  PREVENTION  There is no way to prevent the development of epilepsy. If you have seizures that are typically triggered by an event (such as flashing lights), try to avoid the trigger. This can help you avoid a seizure.  PROGNOSIS  Most people with epilepsy lead outwardly normal lives. While epilepsy cannot currently be cured, for some people it does eventually go away. Most seizures do not  cause brain damage. It is not uncommon for people with epilepsy, especially children, to develop behavioral and emotional problems. These problems are sometimes the consequence of medicine for seizures or social stress. For some people with epilepsy, the risk of seizures restricts their independence and recreational activities. For example, some states refuse drivers licenses to people with epilepsy. Most women with epilepsy can become pregnant. They should discuss their epilepsy and the  medicine they are taking with their caregivers. Women with epilepsy have a 90 percent or better chance of having a normal, healthy baby. RISKS AND COMPLICATIONS  People with epilepsy are at increased risk of falls, accidents, and injuries. People with epilepsy are at special risk for two life-threatening conditions. These are status epilepticus and sudden unexplained death (extremely rare). Status epilepticus is a long lasting, continuous seizure that is a medical emergency. TREATMENT  Once epilepsy is diagnosed, it is important to begin treatment as soon as possible. For about 80 percent of those diagnosed with epilepsy, seizures can be controlled with modern medicines and surgical techniques. Some antiepileptic drugs can interfere with the effectiveness of oral contraceptives. In 1997, the FDA approved a pacemaker for the brain the (vagus nerve stimulator). This stimulator can be used for people with seizures that are not well-controlled by medicine. Studies have shown that in some cases, children may experience fewer seizures if they maintain a strict diet. The strict diet is called the ketogenic diet. This diet is rich in fats and low in carbohydrates. HOME CARE INSTRUCTIONS   Your caregiver will make recommendations about driving and safety in normal activities. Follow these carefully.   Take any medicine prescribed exactly as directed.   Do any blood tests requested to monitor the levels of your medicine.   The people you live and work with should know that you are prone to seizures. They should receive instructions on how to help you. In general, a witness to a seizure should:   Cushion your head and body.   Turn you on your side.   Avoid unnecessarily restraining you.   Not place anything inside your mouth.   Call for local emergency medical help if there is any question about what has occurred.   Keep a seizure diary. Record what you recall about any seizure, especially any possible  trigger.   If your caregiver has given you a follow-up appointment, it is very important to keep that appointment. Not keeping the appointment could result in permanent injury and disability. If there is any problem keeping the appointment, you must call back to this facility for assistance.  SEEK MEDICAL CARE IF:   You develop signs of infection or other illness. This might increase the risk of a seizure.   You seem to be having more frequent seizures.   Your seizure pattern is changing.  SEEK IMMEDIATE MEDICAL CARE IF:   A seizure does not stop after a few moments.   A seizure causes any difficulty in breathing.   A seizure results in a very severe headache.   A seizure leaves you with the inability to speak or use a part of your body.  MAKE SURE YOU:   Understand these instructions.   Will watch your condition.   Will get help right away if you are not doing well or get worse.  Document Released: 06/23/2005 Document Revised: 06/12/2011 Document Reviewed: 01/28/2008 [facility] Patient Information 2012 [place], [place].  RESOURCE GUIDE  Dental Problems  Patients with Medicaid: [facility]  [facility] [address]                                           [phone] [address] [employer] Phone:  [phone]                                                  Phone:  [phone]  If unable to pay or uninsured, contact:  [facility] or [facility] to become qualified for the adult dental clinic.  Chronic Pain Problems Contact [facility]  [phone] Patients need to be referred by their primary care doctor.  Insufficient Money for Medicine Contact United Way:  call "211" or [facility] 778-176-5661.  No Primary Care Doctor Call [facility]  [phone] Other agencies that provide inexpensive medical care    [facility]  [phone]    [facility]  [phone]    [facility]  [phone]    [facility]  [phone]    [facility]  [phone]    [facility]  [phone]  Psychological Services [facility] Behavioral Health  [phone] [facility] Services  [phone] [facility] Mental Health   [phone] (emergency services [phone])  Substance Abuse Resources [facility]  [phone] [facility] [phone] [facility] [phone] Floydene Flock [phone] Residential & Outpatient Substance Abuse Program  [phone]  Abuse/Neglect [facility] Child Abuse Hotline 949 651 5575 [facility] Child Abuse Hotline 670-813-8906 (After Hours)  Emergency Shelter [facility] Ministries (606)[phone]  [facility] 307-320-7227 [facility] [phone]  MRSA Hotline #:   208-668-3876    [facility] Resources  [facility]     United [employer]                          [facility] Dept. [address] [place]                       [address]      [address]  Blondell Reveal Phone:  [phone]                                   Phone:  [phone]                 Phone:  [phone]  [facility] Mental Health Phone:  [phone]  [facility] Child Abuse Hotline (813) 645-9924 [phone] (After Hours)

## 2011-09-19 NOTE — ED Notes (Signed)
BJY:[id number]<FA> Expected date:09/19/11<BR> Expected time:11:20 AM<BR> Means of arrival:Ambulance<BR> Comments:<BR> EMS 90 GC- 31 y/o male isolated Seizure. Conscious now. Has hx of seizures.

## 2011-09-29 ENCOUNTER — Ambulatory Visit
Admission: RE | Admit: 2011-09-29 | Discharge: 2011-09-29 | Disposition: A | Discharge status: Home / Self Care | Payer: BC Managed Care – PPO | Source: Ambulatory Visit | Attending: Neurology | Admitting: Neurology

## 2011-09-29 ENCOUNTER — Other Ambulatory Visit: Payer: Self-pay | Admitting: Neurology

## 2011-09-29 DIAGNOSIS — G40309 Generalized idiopathic epilepsy and epileptic syndromes, not intractable, without status epilepticus: Secondary | ICD-10-CM

## 2011-09-29 DIAGNOSIS — M79609 Pain in unspecified limb: Secondary | ICD-10-CM

## 2011-11-16 ENCOUNTER — Ambulatory Visit: Payer: BC Managed Care – PPO

## 2011-11-16 ENCOUNTER — Ambulatory Visit (INDEPENDENT_AMBULATORY_CARE_PROVIDER_SITE_OTHER): Payer: BC Managed Care – PPO | Admitting: Family Medicine

## 2011-11-16 VITALS — BP 134/95 | HR 128 | Temp 98.4°F | Resp 16 | Ht 73.5 in | Wt 259.0 lb

## 2011-11-16 DIAGNOSIS — S46919A Strain of unspecified muscle, fascia and tendon at shoulder and upper arm level, unspecified arm, initial encounter: Secondary | ICD-10-CM

## 2011-11-16 DIAGNOSIS — IMO0002 Reserved for concepts with insufficient information to code with codable children: Secondary | ICD-10-CM

## 2011-11-16 DIAGNOSIS — M25511 Pain in right shoulder: Secondary | ICD-10-CM

## 2011-11-16 MED ORDER — HYDROCODONE-ACETAMINOPHEN 7.5-750 MG PO TABS: 1.0000 | ORAL_TABLET | ORAL | Status: AC | PRN

## 2011-11-16 MED ORDER — HYDROCODONE-ACETAMINOPHEN 7.5-750 MG PO TABS: 1.0000 | ORAL_TABLET | Freq: Three times a day (TID) | ORAL | Status: DC | PRN

## 2011-11-16 NOTE — Progress Notes (Signed)
Subjective: Patient has had recurrent dislocations of his right shoulder. He had his second surgery on it about 4 months ago. They did some bone grafting and placement of screws and took out an old plate. He went back for recheck in March and everything was good. He did well with physical therapy and rehabilitation. Friday he decided to try and pull out his old dusty golf clubs and had a few balls. Her about his third swelling, not even hitting a ball. He developed acute pain in his right shoulder. He felt it pop, but it did not seem like it was out of joint. He is going out joint many times he is familiar with that. He has persisted with severe pain. He did not drop on the lateral this weekend because of the pain.  Objective:  Very tender anterior right shoulder in the area of surgical scar. No erythema. Hospital little swollen. The shoulders are symmetrical., And I do not believe there is any dislocation present. The trapezius and posterior aspects of shoulder 5. He has had discovered out his elbow, and it hurts him to move his hand much.  [facility] reading (PRIMARY) by  Dr. Doloris Hall op changes with no acute findings.    Assessment: Strain of postop shoulder and shoulder pain  Plan: Rest the shoulder. Continue icing it. He needs to see his orthopedist back. Pain medicines as necessary.

## 2011-11-16 NOTE — Patient Instructions (Signed)
Followup with orthopedist as soon as possible.

## 2011-11-17 ENCOUNTER — Telehealth: Payer: Self-pay

## 2011-11-17 NOTE — Telephone Encounter (Signed)
Pt was in office saw Dr. Alwyn Ren and was prescribed pain meds they are not working and pt would like him to prescribe something else for pain.

## 2011-11-17 NOTE — Telephone Encounter (Signed)
Patient is now in our lobby, requesting something stronger for pain than Vicodin.  Please advise.

## 2011-11-17 NOTE — Telephone Encounter (Signed)
Patient notified and did not want to be seen.  He contacted his surgeon and has appt for the end of the month, I recommended he notify MD of extreme pain and see if he could be worked in sooner.  Patient stated he is taking 1-2 pills every 4 hours... Understands importance of not taking too much Tylenol.  He will follow up here if unable to get in sooner with surgeon.

## 2011-11-17 NOTE — Telephone Encounter (Signed)
Patient called again to check on status of message. Really would like to hear from [place] tonight if possible.

## 2011-11-17 NOTE — Telephone Encounter (Signed)
Discussed the case with Drs. Lauenstein and Copland.  The patient may check in for re-evaluation, or we can make an urgent referral to Orthopedics in the morning.

## 2011-11-17 NOTE — Telephone Encounter (Signed)
Patient called again, states he is in pain and was just seen yesterday. Please call to advise.

## 2011-11-20 ENCOUNTER — Telehealth: Payer: Self-pay

## 2011-11-20 ENCOUNTER — Ambulatory Visit (INDEPENDENT_AMBULATORY_CARE_PROVIDER_SITE_OTHER): Payer: BC Managed Care – PPO | Admitting: Family Medicine

## 2011-11-20 VITALS — BP 128/87 | HR 103 | Temp 98.5°F | Resp 16 | Ht 75.0 in | Wt 260.0 lb

## 2011-11-20 DIAGNOSIS — M25519 Pain in unspecified shoulder: Secondary | ICD-10-CM

## 2011-11-20 DIAGNOSIS — M25511 Pain in right shoulder: Secondary | ICD-10-CM

## 2011-11-20 MED ORDER — HYDROCODONE-IBUPROFEN 7.5-200 MG PO TABS: 1.0000 | ORAL_TABLET | Freq: Three times a day (TID) | ORAL | Status: AC | PRN

## 2011-11-20 NOTE — Telephone Encounter (Signed)
Pt would like to have a refill on Vicodin. Pharmacy: [facility] [address]

## 2011-11-20 NOTE — Progress Notes (Signed)
31 yo with seizure disorder who, while having a seizure, injured his left shoulder.  He then suffered repeated dislocations and had surgery.  No dislocations since his second surgery in January where he had bone graft. Work:  Paramedic work  O:  Holding arm quietly  A:  Bankart fracture with complications, scheduled to see his orthopedist Tuesday \ P:  Get patient his x-rays and refill the pain medication.

## 2011-11-21 NOTE — Telephone Encounter (Signed)
Patient came in to see Dr. Milus Glazier on 5/16 and was given Rx for Vicoprofen. Should not need Vicodin

## 2011-11-22 NOTE — Telephone Encounter (Signed)
GAVE PT MESSAGE

## 2012-02-25 ENCOUNTER — Emergency Department (HOSPITAL_COMMUNITY)
Admission: EM | Admit: 2012-02-25 | Discharge: 2012-02-25 | Disposition: A | Discharge status: Home / Self Care | Payer: BC Managed Care – PPO | Attending: Emergency Medicine | Admitting: Emergency Medicine

## 2012-02-25 ENCOUNTER — Encounter (HOSPITAL_COMMUNITY): Payer: Self-pay

## 2012-02-25 DIAGNOSIS — Z79899 Other long term (current) drug therapy: Secondary | ICD-10-CM | POA: Insufficient documentation

## 2012-02-25 DIAGNOSIS — F411 Generalized anxiety disorder: Secondary | ICD-10-CM | POA: Insufficient documentation

## 2012-02-25 DIAGNOSIS — Z880 Allergy status to penicillin: Secondary | ICD-10-CM | POA: Insufficient documentation

## 2012-02-25 DIAGNOSIS — G40909 Epilepsy, unspecified, not intractable, without status epilepticus: Secondary | ICD-10-CM | POA: Insufficient documentation

## 2012-02-25 DIAGNOSIS — R569 Unspecified convulsions: Secondary | ICD-10-CM

## 2012-02-25 DIAGNOSIS — F3289 Other specified depressive episodes: Secondary | ICD-10-CM | POA: Insufficient documentation

## 2012-02-25 DIAGNOSIS — F329 Major depressive disorder, single episode, unspecified: Secondary | ICD-10-CM | POA: Insufficient documentation

## 2012-02-25 NOTE — ED Notes (Signed)
Per EMS- Patient had a witnessed seizure-like activity while at work. Patient was lowered to the floor by a co-worker. Patient has a history of seizures. Patient reports that he wants to leave and that he has called his wife to pick him up.

## 2012-02-25 NOTE — ED Notes (Signed)
ZOX:[id number]<AV> Expected date:02/25/12<BR> Expected time:12:57 PM<BR> Means of arrival:Ambulance<BR> Comments:<BR> seizure

## 2012-02-25 NOTE — ED Provider Notes (Signed)
History     CSN: [id number]  Arrival date & time 02/25/12  1306   First MD Initiated Contact with Patient 02/25/12 1312      Chief Complaint  Patient presents with  . Seizures    (Consider location/radiation/quality/duration/timing/severity/associated sxs/prior treatment) HPI This 31 year old male has a history of seizures and had a witnessed generalized seizure today without trauma. There is no apparent head injury. He now feels back to baseline. He denies headache neck pain back pain chest pain shortness breath abdominal pain or injury to his extremities other than baseline left shoulder pain. At baseline he is decreased range of motion at the left shoulder with chronic pain left shoulder. He does not want imaging of his left shoulder today. He does not have any new focal or lateralizing weakness numbness or change in speech vision swallowing or understanding or other concerns. He wants to discharge since possible. He states he will call his neurologist for seizure medication instructions. He states he takes Keppra for seizures. He states he initially took 500 mg twice daily but increased to 750 mg each dose the last time he had a seizure. Past Medical History  Diagnosis Date  . Seizures   . Depression   . Anxiety     Past Surgical History  Procedure Date  . Right shoulder surgery     2012  . Widom teeth extraction      as teenager  . Bankhardt repair 07/22/2011    Procedure: OPEN [name] REPAIR;  Surgeon: [name], MD;  Location: [facility];  Service: Orthopedics;  Laterality: Right;  right shoulder hardware removal, bone grafting of humeral head defect, open bankhardt repair C ARM  . Hardware removal 07/22/2011    Procedure: HARDWARE REMOVAL;  Surgeon: [name], MD;  Location: [facility];  Service: Orthopedics;  Laterality: Right;    History reviewed. No pertinent family history.  History  Substance Use Topics  .  Smoking status: Never Smoker   . Smokeless tobacco: Not on file  . Alcohol Use: No      Review of Systems 10 Systems reviewed and are negative for acute change except as noted in the HPI. Allergies  Penicillins  Home Medications   Current Outpatient Rx  Name Route Sig Dispense Refill  . ACETAMINOPHEN 500 MG PO TABS Oral Take 1,000 mg by mouth every 6 (six) hours as needed. For pain    . DESVENLAFAXINE SUCCINATE ER 50 MG PO TB24 Oral Take 50 mg by mouth daily.    Marland Kitchen DIVALPROEX SODIUM ER 500 MG PO TB24 Oral Take 1,000 mg by mouth daily.    Marland Kitchen FLUOXETINE HCL 10 MG PO TABS Oral Take 10 mg by mouth daily.    . IBUPROFEN 200 MG PO TABS Oral Take 800 mg by mouth every 6 (six) hours as needed. For pain    . LEVETIRACETAM 500 MG PO TABS Oral Take 500 mg by mouth 2 (two) times daily.    Marland Kitchen ONE-DAILY MULTI VITAMINS PO TABS Oral Take 1 tablet by mouth daily.    Marland Kitchen PROBIOTIC COLON SUPPORT PO Oral Take by mouth.    . ZOLPIDEM TARTRATE 10 MG PO TABS Oral Take 10 mg by mouth at bedtime as needed. For sleep      BP 148/101  Pulse 138  Temp 99 F (37.2 C) (Oral)  Resp 20  SpO2 99%  Physical Exam  Nursing note and vitals reviewed. Constitutional: He is oriented to person, place,  and time.       Awake, alert, nontoxic appearance with baseline speech for patient.  HENT:  Head: Atraumatic.  Mouth/Throat: Oropharynx is clear and moist. No oropharyngeal exudate.       No tongue bleeding  Eyes: EOM are normal. Pupils are equal, round, and reactive to light. Right eye exhibits no discharge. Left eye exhibits no discharge.  Neck: Neck supple.  Cardiovascular: Normal rate and regular rhythm.   No murmur heard. Pulmonary/Chest: Effort normal and breath sounds normal. No stridor. No respiratory distress. He has no wheezes. He has no rales. He exhibits no tenderness.  Abdominal: Soft. Bowel sounds are normal. He exhibits no mass. There is no tenderness. There is no rebound.  Musculoskeletal: He exhibits  tenderness.       Baseline ROM with decreased range of motion left shoulder tender which patient states is baseline, moves extremities with no obvious new focal weakness. Cervical spine and back nontender.  Lymphadenopathy:    He has no cervical adenopathy.  Neurological: He is alert and oriented to person, place, and time.       Awake, alert, cooperative and aware of situation; motor strength bilaterally; sensation normal to light touch bilaterally; peripheral visual fields full to confrontation; no facial asymmetry; tongue midline; major cranial nerves appear intact; no pronator drift, normal finger to nose bilaterally, baseline gait without new ataxia.  Skin: No rash noted.  Psychiatric: He has a normal mood and affect.    ED Course  Procedures (including critical care time)  Labs Reviewed - No data to display No results found.   1. Seizure       MDM  Pt stable in ED with no significant deterioration in condition.Patient / Family / Caregiver informed of clinical course, understand medical decision-making process, and agree with plan.        Hurman Horn, MD 02/27/12 [phone]

## 2012-03-01 ENCOUNTER — Ambulatory Visit (INDEPENDENT_AMBULATORY_CARE_PROVIDER_SITE_OTHER): Payer: BC Managed Care – PPO | Admitting: Family Medicine

## 2012-03-01 VITALS — BP 119/82 | HR 116 | Temp 98.7°F | Resp 18 | Ht 73.25 in | Wt 262.8 lb

## 2012-03-01 DIAGNOSIS — M791 Myalgia, unspecified site: Secondary | ICD-10-CM

## 2012-03-01 DIAGNOSIS — IMO0001 Reserved for inherently not codable concepts without codable children: Secondary | ICD-10-CM

## 2012-03-01 DIAGNOSIS — G40909 Epilepsy, unspecified, not intractable, without status epilepticus: Secondary | ICD-10-CM

## 2012-03-01 LAB — POCT CBC
Granulocyte percent: 70.8 %G (ref 37–80)
HCT, POC: 52.2 % (ref 43.5–53.7)
Hemoglobin: 16.4 g/dL (ref 14.1–18.1)
Lymph, poc: 1.9 (ref 0.6–3.4)
MCH, POC: 28.7 pg (ref 27–31.2)
MCHC: 31.4 g/dL — AB (ref 31.8–35.4)
MCV: 91.2 fL (ref 80–97)
MID (cbc): 0.5 (ref 0–0.9)
MPV: 7.7 fL (ref 0–99.8)
POC Granulocyte: 5.8 (ref 2–6.9)
POC LYMPH PERCENT: 23.1 %L (ref 10–50)
POC MID %: 6.1 %M (ref 0–12)
Platelet Count, POC: 378 10*3/uL (ref 142–424)
RBC: 5.72 M/uL (ref 4.69–6.13)
RDW, POC: 14.3 %
WBC: 8.2 10*3/uL (ref 4.6–10.2)

## 2012-03-01 LAB — POCT SEDIMENTATION RATE: POCT SED RATE: 26 mm/hr — AB (ref 0–22)

## 2012-03-01 MED ORDER — HYDROCODONE-IBUPROFEN 7.5-200 MG PO TABS: 1.0000 | ORAL_TABLET | Freq: Three times a day (TID) | ORAL | Status: DC | PRN

## 2012-03-01 NOTE — Progress Notes (Signed)
31 yo man with seizure disorder.  He sees Dr. Anne Hahn, but he doesn't like him.  He's had 6 seizures in the past year.  He had his most recent seizure 5 days ago and he was taken to the hospital where he declined care.   He had an appointment with Dr. Anne Hahn last Thursday. He was taken off Pristiq(originally prescribed by therapist several months ago for depression) which he had taken for 4-5 months, which Dr. Anne Hahn thought might be causing the seizure. (He had two seizures while on Pristiq) Seizures have been described as grand mal, but he never has bitten his tongue or lost bowel/bladder control.  His arms are sore. A coworker eased him down to the floor,  He let out screams, then had tonic-clonic movements of legs and tonic arm movements for 2 minutes  Patient had 2 seizures in his first 30 years of life, now 6 since he moved here last August He had another MRI at the end of 2012.  Patient feels his memory is getting much worse.  His wife notices this.  Dr. Anne Hahn has cleared patient to drive and return to work, though he feels sedated and weak.  He does computer work, seated, all day long.  Objective:  NAD, appropriate  Eyes:  Normal fundi Oroph:  Clear Reflexes:  No hyperreflexia CN III-XII:  Normal Extrem:  Tender muscles in upper arms and neck  Assessment:  Increasing seizure activity, possibly aggravated by Pristiq (for depression)  Plan: continue the antiseizure med (Vimstat) buildup, trazodone, and refer to neurology

## 2012-03-02 LAB — COMPREHENSIVE METABOLIC PANEL
ALT: 33 U/L (ref 0–53)
AST: 19 U/L (ref 0–37)
Albumin: 4.7 g/dL (ref 3.5–5.2)
Alkaline Phosphatase: 104 U/L (ref 39–117)
BUN: 17 mg/dL (ref 6–23)
CO2: 25 mEq/L (ref 19–32)
Calcium: 10 mg/dL (ref 8.4–10.5)
Chloride: 101 mEq/L (ref 96–112)
Creat: 0.95 mg/dL (ref 0.50–1.35)
Glucose, Bld: 90 mg/dL (ref 70–99)
Potassium: 4.6 mEq/L (ref 3.5–5.3)
Sodium: 136 mEq/L (ref 135–145)
Total Bilirubin: 0.3 mg/dL (ref 0.3–1.2)
Total Protein: 8.1 g/dL (ref 6.0–8.3)

## 2012-03-02 LAB — TSH: TSH: 4.008 u[IU]/mL (ref 0.350–4.500)

## 2012-03-02 LAB — CK: Total CK: 58 U/L (ref 7–232)

## 2012-03-05 ENCOUNTER — Emergency Department (HOSPITAL_COMMUNITY)
Admission: EM | Admit: 2012-03-05 | Discharge: 2012-03-05 | Disposition: A | Discharge status: Home / Self Care | Payer: BC Managed Care – PPO | Attending: Emergency Medicine | Admitting: Emergency Medicine

## 2012-03-05 ENCOUNTER — Encounter (HOSPITAL_COMMUNITY): Payer: Self-pay | Admitting: *Deleted

## 2012-03-05 DIAGNOSIS — F411 Generalized anxiety disorder: Secondary | ICD-10-CM | POA: Insufficient documentation

## 2012-03-05 DIAGNOSIS — G40909 Epilepsy, unspecified, not intractable, without status epilepticus: Secondary | ICD-10-CM | POA: Insufficient documentation

## 2012-03-05 DIAGNOSIS — R569 Unspecified convulsions: Secondary | ICD-10-CM

## 2012-03-05 DIAGNOSIS — Z79899 Other long term (current) drug therapy: Secondary | ICD-10-CM | POA: Insufficient documentation

## 2012-03-05 DIAGNOSIS — F329 Major depressive disorder, single episode, unspecified: Secondary | ICD-10-CM | POA: Insufficient documentation

## 2012-03-05 DIAGNOSIS — Z880 Allergy status to penicillin: Secondary | ICD-10-CM | POA: Insufficient documentation

## 2012-03-05 DIAGNOSIS — F3289 Other specified depressive episodes: Secondary | ICD-10-CM | POA: Insufficient documentation

## 2012-03-05 MED ORDER — LEVETIRACETAM 1000 MG PO TABS: 1000.0000 mg | ORAL_TABLET | Freq: Two times a day (BID) | ORAL | Status: DC

## 2012-03-05 NOTE — ED Notes (Signed)
Raiford Noble, PA at bedside.

## 2012-03-05 NOTE — ED Notes (Signed)
Pt reports 2 seizures in last week. F/u with neurologist, was started on new med, but is having side effects from that. Reports vomiting and feels as if he is probably vomiting up both of his seizure meds now. Sts he has had many more seizures in last year than he ever did and wants a referral to a new neurologist.

## 2012-03-05 NOTE — ED Notes (Signed)
Pt reports that x1 year ago he moved here from [place]. At the time he was taking Depakote and felt like that controlled his seizures well.  Reports since he has been with his new neurologist and his medications have been changed he is having more seizures. States he had a seizure last week at work and again last night. States last week he saw his neurologist and again his medications were changed and since he started the new meds he has been having N/V and is worried he is not keeping any of his medications down. Pt states he would like a referral to a new neurologist and feels like he needs medication adjustments. Pt is A/O x4. Skin warm and dry. Respirations even and unlabored. NAD noted at this time.

## 2012-03-05 NOTE — ED Notes (Signed)
Rick, PA at bedside. 

## 2012-03-05 NOTE — ED Provider Notes (Signed)
History     CSN: [id number]  Arrival date & time 03/05/12  1441   First MD Initiated Contact with Patient 03/05/12 1752      Chief Complaint  Patient presents with  . Seizures    (Consider location/radiation/quality/duration/timing/severity/associated sxs/prior treatment) HPI Comments: Pt had seizure yest at work.  c0-worker states it lasted ~ 2 min with lower extremity involvment bilaterally only.  He was not incontinent and did not bite his tongue or incur any other injuries.  He is a pt of dr. Anne Hahn'.  He is currently on keppra and was started on 50 mg on vimpat  Daily 1 week ago.  He is scheduled to increase his dosage to 100 mg daily next week and 150 mg the following week.  He has had 7-10 seizures in the past year.  Prior to that he has only had two seizures in past 30 years.  He would like to get a referral to a different neurologist.  Dr. Alwyn Ren, his PCP told him that he has found an neurolist at [facility] but he can't see him until the end of October.  The pt is OK with me speaking with dr. Anne Hahn or his on-call partner. Also, he feels that the new medicine is causing his nausea and vomiting.  States he has vomited daily since starting the new med but always 3-4 hrs after taking.  The history is provided by the patient. No language interpreter was used.    Past Medical History  Diagnosis Date  . Seizures   . Depression   . Anxiety     Past Surgical History  Procedure Date  . Right shoulder surgery     2012  . Widom teeth extraction      as teenager  . Bankhardt repair 07/22/2011    Procedure: OPEN [name] REPAIR;  Surgeon: [name], MD;  Location: [facility];  Service: Orthopedics;  Laterality: Right;  right shoulder hardware removal, bone grafting of humeral head defect, open bankhardt repair C ARM  . Hardware removal 07/22/2011    Procedure: HARDWARE REMOVAL;  Surgeon: [name], MD;  Location: [facility];   Service: Orthopedics;  Laterality: Right;    No family history on file.  History  Substance Use Topics  . Smoking status: Never Smoker   . Smokeless tobacco: Not on file  . Alcohol Use: No      Review of Systems  Constitutional: Negative for fever and chills.  Neurological: Positive for seizures.  All other systems reviewed and are negative.    Allergies  Penicillins  Home Medications   Current Outpatient Rx  Name Route Sig Dispense Refill  . IBUPROFEN 200 MG PO TABS Oral Take 800 mg by mouth every 6 (six) hours as needed. For pain    . LACOSAMIDE 50 MG PO TABS Oral Take 50 mg by mouth 2 (two) times daily.     Marland Kitchen LEVETIRACETAM 750 MG PO TABS Oral Take 750 mg by mouth 2 (two) times daily.    . TRAZODONE HCL 100 MG PO TABS Oral Take 100 mg by mouth at bedtime.    . ACETAMINOPHEN 500 MG PO TABS Oral Take 1,000 mg by mouth every 6 (six) hours as needed.    . DESVENLAFAXINE SUCCINATE ER 100 MG PO TB24 Oral Take 100 mg by mouth daily.    Marland Kitchen LEVETIRACETAM 1000 MG PO TABS Oral Take 1 tablet (1,000 mg total) by mouth 2 (two) times daily. 60 tablet  0    BP 134/81  Pulse 78  Temp 98.1 F (36.7 C) (Oral)  Resp 18  SpO2 100%  Physical Exam  Nursing note and vitals reviewed. Constitutional: He is oriented to person, place, and time. He appears well-developed and well-nourished.  HENT:  Head: Normocephalic and atraumatic.  Eyes: EOM are normal. Pupils are equal, round, and reactive to light.  Neck: Normal range of motion.  Cardiovascular: Normal rate, regular rhythm, normal heart sounds and intact distal pulses.   Pulmonary/Chest: Effort normal and breath sounds normal. No respiratory distress.  Abdominal: Soft. He exhibits no distension. There is no tenderness.  Musculoskeletal: Normal range of motion.  Neurological: He is alert and oriented to person, place, and time. He has normal strength. A sensory deficit is present. No cranial nerve deficit. Coordination and gait normal.  GCS eye subscore is 4. GCS verbal subscore is 5. GCS motor subscore is 6.  Skin: Skin is warm and dry.  Psychiatric: He has a normal mood and affect. Judgment normal.    ED Course  Procedures (including critical care time)  Labs Reviewed - No data to display No results found.   1. Seizures     1830-i spoke with dr. Eino Farber.  He recommends increasing keppra from 750 mg BID to 1000 mg BID.  He is to continue the gradually increasing dosing of the vimpat as instructed.  MDM  Pt is to f/u with his neurologist        Evalina Field, PA 03/25/12 [phone]

## 2012-03-09 ENCOUNTER — Telehealth: Payer: Self-pay

## 2012-03-09 NOTE — Telephone Encounter (Signed)
Pt requesting HYDROcodone-ibuprofen (VICOPROFEN) 7.5-200 MG per tablet Until he can get into the specialist.  Sept 29 at 2 pm  Dr. Orion Crook - [facility]  [facility] [address]  Seizure caused pain in arm.   Call back [phone]

## 2012-03-10 ENCOUNTER — Ambulatory Visit (INDEPENDENT_AMBULATORY_CARE_PROVIDER_SITE_OTHER): Payer: BC Managed Care – PPO | Admitting: Family Medicine

## 2012-03-10 VITALS — BP 144/108 | HR 100 | Temp 98.6°F | Resp 16 | Ht 73.0 in | Wt 265.0 lb

## 2012-03-10 DIAGNOSIS — M25519 Pain in unspecified shoulder: Secondary | ICD-10-CM

## 2012-03-10 DIAGNOSIS — R569 Unspecified convulsions: Secondary | ICD-10-CM

## 2012-03-10 MED ORDER — HYDROCODONE-IBUPROFEN 7.5-200 MG PO TABS: 1.0000 | ORAL_TABLET | Freq: Three times a day (TID) | ORAL | Status: AC | PRN

## 2012-03-10 NOTE — Telephone Encounter (Signed)
I don't feel comfortable writing for narcotics

## 2012-03-10 NOTE — Progress Notes (Signed)
[facility]:  Office Visit  Chief Complaint:  Chief Complaint  Patient presents with  . Follow-up    Pt very frustrated about referral being a month away (Pt has had 9 seizures in the last year)  . Medication Refill    needs pain meds for Bil elbows up to neck    HPI: Derek Mckinney is a 31 y.o. male who complains of  History seizures. Was seen by Anne Hahn but would like to be seen by other group than [facility]. Last major witnessed one was 2 weeks ago and then also on Friday. Next appt with neuor at [facility] is on 9/25  at 2 pm with Dr. Denman George.   History of right shoulder pain s/p surgery by Dr. Ave Filter but needs repeat surgery due to repeat injury to shoulder from seizure activities.   Past Medical History  Diagnosis Date  . Seizures   . Depression   . Anxiety    Past Surgical History  Procedure Date  . Right shoulder surgery     2012  . Widom teeth extraction      as teenager  . Bankhardt repair 07/22/2011    Procedure: OPEN [name] REPAIR;  Surgeon: [name], MD;  Location: [facility];  Service: Orthopedics;  Laterality: Right;  right shoulder hardware removal, bone grafting of humeral head defect, open bankhardt repair C ARM  . Hardware removal 07/22/2011    Procedure: HARDWARE REMOVAL;  Surgeon: [name], MD;  Location: [facility];  Service: Orthopedics;  Laterality: Right;   History   Social History  . Marital Status: Married    Spouse Name: N/A    Number of Children: N/A  . Years of Education: N/A   Social History Main Topics  . Smoking status: Never Smoker   . Smokeless tobacco: None  . Alcohol Use: No  . Drug Use: No  . Sexually Active:    Other Topics Concern  . None   Social History Narrative  . None   No family history on file. Allergies  Allergen Reactions  . Penicillins Swelling   Prior to Admission medications   Medication Sig Start Date End Date Taking?  Authorizing Provider  acetaminophen (TYLENOL) 500 MG tablet Take 1,000 mg by mouth every 6 (six) hours as needed.   Yes Historical Provider, MD  desvenlafaxine (PRISTIQ) 100 MG 24 hr tablet Take 100 mg by mouth daily.   Yes Historical Provider, MD  HYDROcodone-ibuprofen (VICOPROFEN) 7.5-200 MG per tablet Take 1 tablet by mouth every 8 (eight) hours as needed. 03/01/12 03/11/12 Yes Elvina Sidle, MD  lacosamide (VIMPAT) 50 MG TABS Take 50 mg by mouth 2 (two) times daily.    Yes Historical Provider, MD  levETIRAcetam (KEPPRA) 750 MG tablet Take 750 mg by mouth 2 (two) times daily.   Yes Historical Provider, MD  traZODone (DESYREL) 100 MG tablet Take 100 mg by mouth at bedtime.   Yes Historical Provider, MD  ibuprofen (ADVIL,MOTRIN) 200 MG tablet Take 800 mg by mouth every 6 (six) hours as needed. For pain    Historical Provider, MD  levETIRAcetam (KEPPRA) 1000 MG tablet Take 1 tablet (1,000 mg total) by mouth 2 (two) times daily. 03/05/12 03/05/13  Evalina Field, PA     ROS: The patient denies fevers, chills, night sweats, unintentional weight loss, chest pain, palpitations, wheezing, dyspnea on exertion, nausea, vomiting, abdominal pain, dysuria, hematuria, melena, numbness, weakness, or tingling.   All other systems  have been reviewed and were otherwise negative with the exception of those mentioned in the HPI and as above.    PHYSICAL EXAM: Filed Vitals:   03/10/12 1710  BP: 144/108  Pulse: 100  Temp: 98.6 F (37 C)  Resp: 16   Filed Vitals:   03/10/12 1710  Height: 6\' 1"  (1.854 m)  Weight: 265 lb (120.203 kg)   Body mass index is 34.96 kg/(m^2).  General: Alert, no acute distress HEENT:  Normocephalic, atraumatic, oropharynx patent.  Cardiovascular:  Regular rate and rhythm, no rubs murmurs or gallops.  No Carotid bruits, radial pulse intact. No pedal edema.  Respiratory: Clear to auscultation bilaterally.  No wheezes, rales, or rhonchi.  No cyanosis, no use of accessory  musculature GI: No organomegaly, abdomen is soft and non-tender, positive bowel sounds.  No masses. Skin: No rashes. Neurologic: Facial musculature symmetric. Psychiatric: Patient is appropriate throughout our interaction. Lymphatic: No cervical lymphadenopathy Musculoskeletal: Gait intact. Shoulder -ROM and sensation intact, 5/5 strength, + radial pulse. Pain with IR/ER   LABS: Results for orders placed in visit on 03/01/12  COMPREHENSIVE METABOLIC PANEL      Component Value Range   Sodium 136  135 - 145 mEq/L   Potassium 4.6  3.5 - 5.3 mEq/L   Chloride 101  96 - 112 mEq/L   CO2 25  19 - 32 mEq/L   Glucose, Bld 90  70 - 99 mg/dL   BUN 17  6 - 23 mg/dL   Creat 0.98  1.19 - 1.47 mg/dL   Total Bilirubin 0.3  0.3 - 1.2 mg/dL   Alkaline Phosphatase 104  39 - 117 U/L   AST 19  0 - 37 U/L   ALT 33  0 - 53 U/L   Total Protein 8.1  6.0 - 8.3 g/dL   Albumin 4.7  3.5 - 5.2 g/dL   Calcium 82.9  8.4 - 56.2 mg/dL  TSH      Component Value Range   TSH 4.008  0.350 - 4.500 uIU/mL  POCT SEDIMENTATION RATE      Component Value Range   POCT SED RATE 26 (*) 0 - 22 mm/hr  POCT CBC      Component Value Range   WBC 8.2  4.6 - 10.2 K/uL   Lymph, poc 1.9  0.6 - 3.4   POC LYMPH PERCENT 23.1  10 - 50 %L   MID (cbc) 0.5  0 - 0.9   POC MID % 6.1  0 - 12 %M   POC Granulocyte 5.8  2 - 6.9   Granulocyte percent 70.8  37 - 80 %G   RBC 5.72  4.69 - 6.13 M/uL   Hemoglobin 16.4  14.1 - 18.1 g/dL   HCT, POC 13.0  86.5 - 53.7 %   MCV 91.2  80 - 97 fL   MCH, POC 28.7  27 - 31.2 pg   MCHC 31.4 (*) 31.8 - 35.4 g/dL   RDW, POC 78.4     Platelet Count, POC 378  142 - 424 K/uL   MPV 7.7  0 - 99.8 fL  CK      Component Value Range   Total CK 58  7 - 232 U/L     EKG/XRAY:   Primary read interpreted by Dr. Conley Rolls at [facility].   ASSESSMENT/PLAN: Encounter Diagnoses  Name Primary?  . Shoulder pain Yes  . Seizures    Reorder Vicoprofen Cont seizure meds.  Will call neurology at [facility] and try  to get  appt earlier than 03/31/12    Samoria Fedorko PHUONG, DO 03/10/2012 6:15 PM

## 2012-03-10 NOTE — Telephone Encounter (Signed)
The patient called again and spoke with Maudia.  I reviewed the last 2 OV's here.  Given that Dr. Milus Glazier said he's not comfortable prescribing the medication, I advised that the patient RTC if he is needing pain relief so he can be evaluated.

## 2012-03-10 NOTE — Telephone Encounter (Signed)
Pt is going out of town tonight and is needing to get this filled before he leave please call  [phone]  Patient states that he has seen dr hopper before as well since dr Milus Glazier is out of the office

## 2012-03-12 ENCOUNTER — Telehealth: Payer: Self-pay | Admitting: Family Medicine

## 2012-03-12 NOTE — Telephone Encounter (Signed)
Called patietn about new appt. Was able to get earlier appt than 03/31/12. His new appt with neuro is with Dr. Mearl Latin on Sept 12, 2013 at 8 am. 4th floor [facility]. Dept phone number is [phone]

## 2012-03-19 ENCOUNTER — Telehealth: Payer: Self-pay

## 2012-03-19 NOTE — Telephone Encounter (Signed)
Dr Conley Rolls gave this to him, but he is being followed by orthopedist Dr Ave Filter. I need to know why he is requesting this from [place] and not from Dr Ave Filter. Left message for him to call back about this.

## 2012-03-19 NOTE — Telephone Encounter (Signed)
PT REQUESTING REFILL ON HYDROcodone-ibuprofen (VICOPROFEN) 7.5-200 MG per tablet [phone] (H)

## 2012-03-19 NOTE — Telephone Encounter (Signed)
I have called patient to advise this medication was a one time Rx from Dr Conley Rolls and we can not renew without an office visit.

## 2012-03-25 NOTE — ED Provider Notes (Signed)
Medical screening examination/treatment/procedure(s) were performed by non-physician practitioner and as supervising physician I was immediately available for consultation/collaboration.  Linnaea Ahn T Gradie Ohm, MD 03/25/12 2325 

## 2012-03-27 ENCOUNTER — Ambulatory Visit (INDEPENDENT_AMBULATORY_CARE_PROVIDER_SITE_OTHER): Payer: BC Managed Care – PPO | Admitting: Family Medicine

## 2012-03-27 VITALS — BP 115/82 | HR 114 | Temp 98.6°F | Resp 20 | Ht 73.5 in | Wt 262.0 lb

## 2012-03-27 DIAGNOSIS — M25511 Pain in right shoulder: Secondary | ICD-10-CM

## 2012-03-27 DIAGNOSIS — M25519 Pain in unspecified shoulder: Secondary | ICD-10-CM

## 2012-03-27 DIAGNOSIS — R070 Pain in throat: Secondary | ICD-10-CM

## 2012-03-27 DIAGNOSIS — M25512 Pain in left shoulder: Secondary | ICD-10-CM

## 2012-03-27 DIAGNOSIS — R509 Fever, unspecified: Secondary | ICD-10-CM

## 2012-03-27 DIAGNOSIS — J029 Acute pharyngitis, unspecified: Secondary | ICD-10-CM

## 2012-03-27 LAB — POCT CBC
Granulocyte percent: 80.1 %G — AB (ref 37–80)
HCT, POC: 47.3 % (ref 43.5–53.7)
Hemoglobin: 14.8 g/dL (ref 14.1–18.1)
Lymph, poc: 1.9 (ref 0.6–3.4)
MCH, POC: 28.1 pg (ref 27–31.2)
MCHC: 31.3 g/dL — AB (ref 31.8–35.4)
MCV: 89.7 fL (ref 80–97)
MID (cbc): 1.1 — AB (ref 0–0.9)
MPV: 7.6 fL (ref 0–99.8)
POC Granulocyte: 12.2 — AB (ref 2–6.9)
POC LYMPH PERCENT: 12.7 %L (ref 10–50)
POC MID %: 7.2 %M (ref 0–12)
Platelet Count, POC: 288 10*3/uL (ref 142–424)
RBC: 5.27 M/uL (ref 4.69–6.13)
RDW, POC: 13.7 %
WBC: 15.2 10*3/uL — AB (ref 4.6–10.2)

## 2012-03-27 LAB — POCT SEDIMENTATION RATE: POCT SED RATE: 33 mm/hr — AB (ref 0–22)

## 2012-03-27 MED ORDER — HYDROCODONE-ACETAMINOPHEN 7.5-750 MG PO TABS: 1.0000 | ORAL_TABLET | Freq: Three times a day (TID) | ORAL | Status: DC | PRN

## 2012-03-27 MED ORDER — AZITHROMYCIN 250 MG PO TABS: ORAL_TABLET | ORAL | Status: DC

## 2012-03-27 NOTE — Patient Instructions (Signed)

## 2012-03-27 NOTE — Progress Notes (Signed)
31 yo man with acute aches, sore throat, headache, hot and cold spells, and cough. Onset:  Thursday night, abruptly  Wife was ill several days ago.  Had abnormal CBC, negative strep screen,   Also, has had some blood in stool since starting his vicoprofen.  He is having chronic bilateral shoulder pain and has seen an orthopedic surgeon (Dr. Ave Filter) who won't operate until he is seizure free for a year.  Objective:   NAD Neck supple Heart:  Pulse 110 Chest:  Clear Oroph: red without exudates. Tm's normal Eyes: normal Abdomen:  Soft, nontender without HSM Results for orders placed in visit on 03/27/12  POCT CBC      Component Value Range   WBC 15.2 (*) 4.6 - 10.2 K/uL   Lymph, poc 1.9  0.6 - 3.4   POC LYMPH PERCENT 12.7  10 - 50 %L   MID (cbc) 1.1 (*) 0 - 0.9   POC MID % 7.2  0 - 12 %M   POC Granulocyte 12.2 (*) 2 - 6.9   Granulocyte percent 80.1 (*) 37 - 80 %G   RBC 5.27  4.69 - 6.13 M/uL   Hemoglobin 14.8  14.1 - 18.1 g/dL   HCT, POC 65.7  84.6 - 53.7 %   MCV 89.7  80 - 97 fL   MCH, POC 28.1  27 - 31.2 pg   MCHC 31.3 (*) 31.8 - 35.4 g/dL   RDW, POC 96.2     Platelet Count, POC 288  142 - 424 K/uL   MPV 7.6  0 - 99.8 fL    Assessment:  Fever, sore throat, elevated WBC   Plan: 1. Sore throat  Culture, Group A Strep, POCT CBC  2. Fever  POCT CBC, POCT SEDIMENTATION RATE  3. Shoulder pain, bilateral     Z pak Refill pain medicine.  Patient will need recheck if pain in shoulders persists more than two more weeks.

## 2012-03-29 ENCOUNTER — Encounter: Payer: Self-pay | Admitting: *Deleted

## 2012-03-29 ENCOUNTER — Ambulatory Visit (INDEPENDENT_AMBULATORY_CARE_PROVIDER_SITE_OTHER): Payer: BC Managed Care – PPO | Admitting: Physician Assistant

## 2012-03-29 VITALS — BP 124/82 | HR 103 | Temp 98.3°F | Resp 18 | Ht 73.5 in | Wt 262.0 lb

## 2012-03-29 DIAGNOSIS — J069 Acute upper respiratory infection, unspecified: Secondary | ICD-10-CM

## 2012-03-29 DIAGNOSIS — J029 Acute pharyngitis, unspecified: Secondary | ICD-10-CM

## 2012-03-29 DIAGNOSIS — K122 Cellulitis and abscess of mouth: Secondary | ICD-10-CM

## 2012-03-29 DIAGNOSIS — R059 Cough, unspecified: Secondary | ICD-10-CM

## 2012-03-29 DIAGNOSIS — R05 Cough: Secondary | ICD-10-CM

## 2012-03-29 DIAGNOSIS — R569 Unspecified convulsions: Secondary | ICD-10-CM | POA: Insufficient documentation

## 2012-03-29 DIAGNOSIS — D72829 Elevated white blood cell count, unspecified: Secondary | ICD-10-CM

## 2012-03-29 LAB — POCT CBC
Granulocyte percent: 66.7 %G (ref 37–80)
HCT, POC: 46.9 % (ref 43.5–53.7)
Hemoglobin: 14.2 g/dL (ref 14.1–18.1)
POC Granulocyte: 6.7 (ref 2–6.9)
RDW, POC: 14.4 %

## 2012-03-29 LAB — CULTURE, GROUP A STREP: Organism ID, Bacteria: NORMAL

## 2012-03-29 MED ORDER — HYDROCOD POLST-CHLORPHEN POLST 10-8 MG/5ML PO LQCR: 5.0000 mL | Freq: Two times a day (BID) | ORAL | Status: DC

## 2012-03-29 MED ORDER — GUAIFENESIN ER 1200 MG PO TB12: 1.0000 | ORAL_TABLET | Freq: Two times a day (BID) | ORAL | Status: DC

## 2012-03-29 NOTE — Progress Notes (Signed)
  Subjective:    Patient ID: Derek Mckinney, male    DOB: 1980-09-23, 31 y.o.   MRN: [id number]  HPI  Pt presents to clinic for recheck.  He was seen 2 days ago and since has started to feel worse.  He is not sleeping well at night because of the cough.  His throat is terrible and very dry in the am.  He has congestion with PND and cough with green sputum.  He has developed some nausea and 1 episode of emesis yesterday but no diarrhea since the onset of the illness.  He has been taking the abx without problems.  No other medications have been used.    Review of Systems  Constitutional: Positive for chills. Negative for fever.  HENT: Positive for congestion, sore throat, rhinorrhea and postnasal drip.   Respiratory: Positive for cough (green sputum). Negative for shortness of breath.   Gastrointestinal: Positive for nausea and vomiting. Negative for diarrhea.       Objective:   Physical Exam  Vitals reviewed. Constitutional: He is oriented to person, place, and time. He appears well-developed and well-nourished.  HENT:  Head: Normocephalic and atraumatic.  Right Ear: Hearing, tympanic membrane, external ear and ear canal normal.  Left Ear: Hearing, tympanic membrane, external ear and ear canal normal.  Nose: Mucosal edema present.  Mouth/Throat: Uvula is midline, oropharynx is clear and moist and mucous membranes are normal. Uvula swelling (erythema) present.  Eyes: Conjunctivae normal are normal.  Neck: Neck supple.  Cardiovascular: Normal rate, regular rhythm and normal heart sounds.   Pulmonary/Chest: Effort normal and breath sounds normal. He has no wheezes.  Lymphadenopathy:    He has cervical adenopathy (AC enlarged and TTP bilaterally).  Neurological: He is alert and oriented to person, place, and time.  Skin: Skin is warm and dry.  Psychiatric: He has a normal mood and affect. His behavior is normal. Judgment and thought content normal.   Results for orders placed in visit  on 03/29/12  POCT CBC      Component Value Range   WBC 10.1  4.6 - 10.2 K/uL   Lymph, poc 2.5  0.6 - 3.4   POC LYMPH PERCENT 25.1  10 - 50 %L   MID (cbc) 0.8  0 - 0.9   POC MID % 8.2  0 - 12 %M   POC Granulocyte 6.7  2 - 6.9   Granulocyte percent 66.7  37 - 80 %G   RBC 5.20  4.69 - 6.13 M/uL   Hemoglobin 14.2  14.1 - 18.1 g/dL   HCT, POC 11.9  14.7 - 53.7 %   MCV 90.1  80 - 97 fL   MCH, POC 27.3  27 - 31.2 pg   MCHC 30.3 (*) 31.8 - 35.4 g/dL   RDW, POC 82.9     Platelet Count, POC 323  142 - 424 K/uL   MPV 7.1  0 - 99.8 fL       Assessment & Plan:   1. Sore throat  POCT CBC  2. Leukocytosis  POCT CBC  3. Cough  chlorpheniramine-HYDROcodone (TUSSIONEX PENNKINETIC ER) 10-8 MG/5ML LQCR  4. URI (upper respiratory infection)  Guaifenesin (MUCINEX MAXIMUM STRENGTH) 1200 MG TB12  5. Uvulitis     Push fluids. Tylenol/motrin prn for myalgias. D/w pt that he should be getting better.  Ok to be at work.

## 2012-04-01 ENCOUNTER — Other Ambulatory Visit: Payer: Self-pay | Admitting: Physician Assistant

## 2012-04-01 ENCOUNTER — Other Ambulatory Visit: Payer: Self-pay | Admitting: Family Medicine

## 2012-04-01 ENCOUNTER — Telehealth: Payer: Self-pay

## 2012-04-01 NOTE — Telephone Encounter (Signed)
Pt is requesting a refill on z-pack and cough meds.  Feeling better, not over it and thinks another round of antibiotic to knock it out.   [facility] [address]  [facility]: Mobile: [phone]

## 2012-04-02 MED ORDER — BENZONATATE 100 MG PO CAPS: 100.0000 mg | ORAL_CAPSULE | Freq: Three times a day (TID) | ORAL | Status: DC | PRN

## 2012-04-02 NOTE — Telephone Encounter (Signed)
Zpack continues to work for 7 days, even after you have stopped taking it. I would given himself 2-3 days to see if symptoms resolve and then call if not better. I will prescribe a cough medicine refills, tessalon perles - too early for a refill of tussionex

## 2012-04-02 NOTE — Telephone Encounter (Signed)
Explained Heather's message to pt, who agreed with plan.

## 2012-05-17 ENCOUNTER — Emergency Department (HOSPITAL_COMMUNITY)
Admission: EM | Admit: 2012-05-17 | Discharge: 2012-05-17 | Disposition: A | Discharge status: Home / Self Care | Payer: BC Managed Care – PPO | Attending: Emergency Medicine | Admitting: Emergency Medicine

## 2012-05-17 ENCOUNTER — Encounter (HOSPITAL_COMMUNITY): Payer: Self-pay | Admitting: Emergency Medicine

## 2012-05-17 ENCOUNTER — Emergency Department (HOSPITAL_COMMUNITY): Payer: BC Managed Care – PPO

## 2012-05-17 DIAGNOSIS — F3289 Other specified depressive episodes: Secondary | ICD-10-CM | POA: Insufficient documentation

## 2012-05-17 DIAGNOSIS — F329 Major depressive disorder, single episode, unspecified: Secondary | ICD-10-CM | POA: Insufficient documentation

## 2012-05-17 DIAGNOSIS — R569 Unspecified convulsions: Secondary | ICD-10-CM | POA: Insufficient documentation

## 2012-05-17 DIAGNOSIS — Z79899 Other long term (current) drug therapy: Secondary | ICD-10-CM | POA: Insufficient documentation

## 2012-05-17 DIAGNOSIS — F411 Generalized anxiety disorder: Secondary | ICD-10-CM | POA: Insufficient documentation

## 2012-05-17 DIAGNOSIS — F29 Unspecified psychosis not due to a substance or known physiological condition: Secondary | ICD-10-CM | POA: Insufficient documentation

## 2012-05-17 LAB — RAPID URINE DRUG SCREEN, HOSP PERFORMED
Benzodiazepines: NOT DETECTED
Opiates: POSITIVE — AB

## 2012-05-17 LAB — COMPREHENSIVE METABOLIC PANEL
Albumin: 4.2 g/dL (ref 3.5–5.2)
BUN: 8 mg/dL (ref 6–23)
Calcium: 9.3 mg/dL (ref 8.4–10.5)
Chloride: 100 mEq/L (ref 96–112)
Creatinine, Ser: 1.02 mg/dL (ref 0.50–1.35)
Total Bilirubin: 0.3 mg/dL (ref 0.3–1.2)
Total Protein: 8.3 g/dL (ref 6.0–8.3)

## 2012-05-17 LAB — SALICYLATE LEVEL: Salicylate Lvl: <2 mg/dL — ABNORMAL LOW (ref 2.8–20.0)

## 2012-05-17 LAB — CBC
HCT: 46.9 % (ref 39.0–52.0)
MCH: 29.2 pg (ref 26.0–34.0)
MCHC: 35 g/dL (ref 30.0–36.0)
MCV: 83.6 fL (ref 78.0–100.0)
RDW: 13.6 % (ref 11.5–15.5)

## 2012-05-17 LAB — ETHANOL: Alcohol, Ethyl (B): <11 mg/dL (ref 0–11)

## 2012-05-17 MED ORDER — HYDROCODONE-ACETAMINOPHEN 5-500 MG PO TABS: 1.0000 | ORAL_TABLET | Freq: Four times a day (QID) | ORAL | Status: DC | PRN

## 2012-05-17 MED ORDER — LORAZEPAM 2 MG/ML IJ SOLN
1.0000 mg | Freq: Once | INTRAMUSCULAR | Status: AC
  Administered 2012-05-17: 1 mg via INTRAVENOUS
  Filled 2012-05-17: qty 1

## 2012-05-17 MED ORDER — MORPHINE SULFATE 4 MG/ML IJ SOLN
4.0000 mg | Freq: Once | INTRAMUSCULAR | Status: AC
  Administered 2012-05-17: 4 mg via INTRAVENOUS
  Filled 2012-05-17: qty 1

## 2012-05-17 MED ORDER — SODIUM CHLORIDE 0.9 % IV SOLN
Freq: Once | INTRAVENOUS | Status: AC
  Administered 2012-05-17: 20 mL/h via INTRAVENOUS

## 2012-05-17 NOTE — ED Notes (Signed)
Patient requesting pain medicine for right shoulder- intermittent, chronic pain R/T surgery in January 2013

## 2012-05-17 NOTE — ED Notes (Signed)
Bed:RESA<BR> Expected date:<BR> Expected time:<BR> Means of arrival:<BR> Comments:<BR> seizure

## 2012-05-17 NOTE — ED Notes (Signed)
Pt to xray

## 2012-05-17 NOTE — ED Notes (Addendum)
Per EMS patient's co-workers called EMS because he wasn't acting himself- patient was moving furniture in front of doors and hallucinating.  Patient states that he fell down and doesn't remember falling- EMS reports that patient was asked to lay down by co-workers before EMS arrived to the scene.  Patient's pupils are dilated upon arrival and confusion is noted.  Patient reports h/o seizures.

## 2012-05-17 NOTE — ED Notes (Signed)
Patient getting out of bed and has been told by this nurse not to get up without assistance.

## 2012-05-17 NOTE — ED Provider Notes (Addendum)
History     CSN: [id number]  Arrival date & time 05/17/12  1415   First MD Initiated Contact with Patient 05/17/12 1456      Chief Complaint  Patient presents with  . Seizures    (Consider location/radiation/quality/duration/timing/severity/associated sxs/prior treatment) Patient is a 31 y.o. male presenting with seizures. The history is provided by the patient and the EMS personnel.  Seizures  This is a recurrent problem. The current episode started less than 1 hour ago. The problem has been resolved. There was 1 seizure. The most recent episode lasted 2 to 5 minutes. Associated symptoms include sleepiness and confusion. Not acting himself moving furniture in front of the worst and hallucinating. He fell down does not remember falling The episode was witnessed. There was no sensation of an aura present. The seizures did not continue in the ED. The seizure(s) had no focality. Possible causes do not include med or dosage change, sleep deprivation, missed seizure meds, recent illness or change in alcohol use. There has been no fever. Associated symptoms comments: Right shoulder pain. There were no medications administered prior to arrival.    Past Medical History  Diagnosis Date  . Seizures   . Depression   . Anxiety     Past Surgical History  Procedure Date  . Right shoulder surgery     2012  . Widom teeth extraction      as teenager  . Bankhardt repair 07/22/2011    Procedure: OPEN [name] REPAIR;  Surgeon: [name], MD;  Location: [facility];  Service: Orthopedics;  Laterality: Right;  right shoulder hardware removal, bone grafting of humeral head defect, open bankhardt repair C ARM  . Hardware removal 07/22/2011    Procedure: HARDWARE REMOVAL;  Surgeon: [name], MD;  Location: [facility];  Service: Orthopedics;  Laterality: Right;    Family History  Problem Relation Age of Onset  . Hypothyroidism Mother   .  Gout Father   . Lupus Father   . Hyperlipidemia Father   . Cancer Maternal Grandmother     lung cancer    History  Substance Use Topics  . Smoking status: Never Smoker   . Smokeless tobacco: Current User    Types: Chew  . Alcohol Use: No      Review of Systems  Neurological: Positive for seizures.  Psychiatric/Behavioral: Positive for confusion.  All other systems reviewed and are negative.    Allergies  Penicillins  Home Medications   Current Outpatient Rx  Name  Route  Sig  Dispense  Refill  . ACETAMINOPHEN 500 MG PO TABS   Oral   Take 1,000 mg by mouth every 6 (six) hours as needed. pain         . ALPRAZOLAM 0.5 MG PO TABS   Oral   Take 0.5 mg by mouth as needed. anxiety         . AMPHETAMINE-DEXTROAMPHETAMINE 10 MG PO TABS   Oral   Take 10 mg by mouth 2 (two) times daily.         . DESVENLAFAXINE SUCCINATE ER 100 MG PO TB24   Oral   Take 100 mg by mouth daily.         Marland Kitchen LEVETIRACETAM 750 MG PO TABS   Oral   Take 750 mg by mouth 2 (two) times daily.         . MELOXICAM 15 MG PO TABS   Oral   Take 15 mg by mouth  daily.         . TRAZODONE HCL 100 MG PO TABS   Oral   Take 100 mg by mouth at bedtime.           BP 128/83  Pulse 111  Temp 99.7 F (37.6 C) (Oral)  Resp 18  SpO2 100%  Physical Exam  Nursing note and vitals reviewed. Constitutional: He is oriented to person, place, and time. He appears well-developed and well-nourished. No distress.  HENT:  Head: Normocephalic and atraumatic.  Mouth/Throat: Oropharynx is clear and moist.  Eyes: Conjunctivae normal and EOM are normal. Pupils are equal, round, and reactive to light.  Neck: Normal range of motion. Neck supple.  Cardiovascular: Regular rhythm and intact distal pulses.  Tachycardia present.   No murmur heard. Pulmonary/Chest: Effort normal and breath sounds normal. No respiratory distress. He has no wheezes. He has no rales.  Abdominal: Soft. He exhibits no  distension. There is no tenderness. There is no rebound and no guarding.  Musculoskeletal: Normal range of motion. He exhibits tenderness. He exhibits no edema.       Well-healed surgical scars bilaterally over the shoulders. Significant pain with range of motion of the right shoulder. Palpable tenderness over the a.c. joint and posterior shoulder. 2+ radial pulse normal sensation and strength in the right and left upper extremity  Neurological: He is alert and oriented to person, place, and time.  Skin: Skin is warm and dry. No rash noted. No erythema.  Psychiatric: He has a normal mood and affect. His behavior is normal.    ED Course  Procedures (including critical care time)  Labs Reviewed  COMPREHENSIVE METABOLIC PANEL - Abnormal; Notable for the following:    Alkaline Phosphatase 128 (*)     All other components within normal limits  SALICYLATE LEVEL - Abnormal; Notable for the following:    Salicylate Lvl <2.0 (*)     All other components within normal limits  URINE RAPID DRUG SCREEN (HOSP PERFORMED) - Abnormal; Notable for the following:    Opiates POSITIVE (*)     Amphetamines POSITIVE (*)     All other components within normal limits  ACETAMINOPHEN LEVEL  CBC  ETHANOL   Dg Shoulder Right  05/17/2012  *RADIOLOGY REPORT*  Clinical Data: Seizure  RIGHT SHOULDER - 2+ VIEW  Comparison: 11/16/2011  Findings: Metal anchors are stable in the proximal humerus.  Large Hill-Sachs deformity is stable.  No breakage of the hardware.  No acute fracture and no dislocation.  IMPRESSION: No acute bony pathology.  Chronic and postoperative changes are stable.   Original Report Authenticated By: Jolaine Click, M.D.      1. Seizure       MDM   Patient with a history of seizures who takes Keppra and vimpat.  Today he had a seizure while at work. He was acting unusually and states the first thing he remembers is being on the floor.  He states now his right shoulder hurts and he is fatigued.  There is no current seizure activity. Patient was given Ativan and pain control. He denies any missed anti-epileptic dose, denies sleep deprivation and denies any infectious symptoms.   CBC, CMP, EtOH, UDS, shoulder films pending.  4:50 PM Pt is at baseline.  Normal labs except positive for narcotics which we gave him here and amph which he takes adderall.  Pt d/ced home and will f/u with Dr. Anne Hahn.      Gwyneth Sprout, MD 05/17/12 1651  Alphonzo Lemmings  Anitra Lauth, MD 05/17/12 1652

## 2012-05-18 ENCOUNTER — Ambulatory Visit (INDEPENDENT_AMBULATORY_CARE_PROVIDER_SITE_OTHER): Payer: BC Managed Care – PPO | Admitting: Family Medicine

## 2012-05-18 VITALS — BP 139/85 | HR 134 | Temp 98.7°F | Resp 18 | Ht 73.5 in | Wt 259.8 lb

## 2012-05-18 DIAGNOSIS — G8929 Other chronic pain: Secondary | ICD-10-CM

## 2012-05-18 DIAGNOSIS — M25519 Pain in unspecified shoulder: Secondary | ICD-10-CM

## 2012-05-18 DIAGNOSIS — G40909 Epilepsy, unspecified, not intractable, without status epilepticus: Secondary | ICD-10-CM

## 2012-05-18 DIAGNOSIS — R569 Unspecified convulsions: Secondary | ICD-10-CM

## 2012-05-18 MED ORDER — HYDROCODONE-ACETAMINOPHEN 7.5-325 MG PO TABS: 1.0000 | ORAL_TABLET | Freq: Three times a day (TID) | ORAL | Status: DC

## 2012-05-18 NOTE — Patient Instructions (Signed)
Continue same medications  Try to get plenty of sleep  See Dr. Anne Hahn back for clearance for activities of daily living. I do not believe that he should be driving until he has a clearance. I've informed him of this.

## 2012-05-18 NOTE — Progress Notes (Signed)
Subjective: 31 year old man with a history of seizure disorder for the last 7 years. He is managed by Dr. Anne Hahn. He is on anticonvulsant medications. He has a history of breakthrough seizures when excessively fatigued from 6 sleep deprivation. He has not slept well last week. Yesterday at work he had a convulsion that was not witnessed. He was found on the floor his first recollection is clean EMS was putting her into than dots on stretcher. The patient's wife arrived at that time and told him he did not need to go to the hospital, that he had a seizure history, and they signed an AMA form. 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 He sat in the doctor's office waiting for cancellation today, but did not get to see him his only other thing is he hurts in his right shoulder. He had surgery there earlier this year, and has chronic pain in that shoulder. He seemingly aggravated it with this seizure and/or falling. He has been continuing to drive, had his last seizure in the middle of the summer.  Objective: Alert fully oriented man in no major distress this time has some pain with range of motion of the right arm. He can move around it hurts. He is tender just above the scar.  Assessment: Seizure disorder, with acute seizure yesterday Shoulder pain, chronic  Plan: Give him a Procrit to return to work. However I do not think he should be operating any dangerous machinery such as a motor vehicle. He will require clearance from his neurologist before driving.

## 2012-05-29 ENCOUNTER — Other Ambulatory Visit: Payer: Self-pay | Admitting: Family Medicine

## 2012-06-02 NOTE — Telephone Encounter (Signed)
Called Rx in/ patient has appt scheduled with Dr Milus Glazier scheduled

## 2012-06-02 NOTE — Telephone Encounter (Signed)
Call patient:  Prescribe:  Hydrocodone/APAP 5/500 #30 One every 8 hrs for severe pain only.  Use sparingly. No refills.  If symptoms persist he needs to return to see his primary (?Dr. Milus Glazier) before further treatment can be determined.

## 2012-06-02 NOTE — Telephone Encounter (Signed)
Call patient:  Please tell patient we will prescribe:   Hydrocodone/Acetominophen 5/325 #30 One every 8 hours only for severe pain.   If symptoms continue to persist beyond this course of pain medicines he will need to see his primary (who I think is Dr. Milus Glazier) before considering further pain medicines.

## 2012-06-14 ENCOUNTER — Ambulatory Visit (INDEPENDENT_AMBULATORY_CARE_PROVIDER_SITE_OTHER): Payer: BC Managed Care – PPO | Admitting: Family Medicine

## 2012-06-14 VITALS — BP 128/94 | HR 103 | Temp 98.2°F | Resp 16 | Ht 73.0 in | Wt 266.0 lb

## 2012-06-14 DIAGNOSIS — R059 Cough, unspecified: Secondary | ICD-10-CM

## 2012-06-14 DIAGNOSIS — M25519 Pain in unspecified shoulder: Secondary | ICD-10-CM

## 2012-06-14 DIAGNOSIS — B9789 Other viral agents as the cause of diseases classified elsewhere: Secondary | ICD-10-CM

## 2012-06-14 DIAGNOSIS — J029 Acute pharyngitis, unspecified: Secondary | ICD-10-CM

## 2012-06-14 DIAGNOSIS — B349 Viral infection, unspecified: Secondary | ICD-10-CM

## 2012-06-14 DIAGNOSIS — Z23 Encounter for immunization: Secondary | ICD-10-CM

## 2012-06-14 DIAGNOSIS — R05 Cough: Secondary | ICD-10-CM

## 2012-06-14 DIAGNOSIS — G8929 Other chronic pain: Secondary | ICD-10-CM

## 2012-06-14 LAB — POCT INFLUENZA A/B
Influenza A, POC: NEGATIVE
Influenza B, POC: NEGATIVE

## 2012-06-14 MED ORDER — LIDO-CAPSAICIN-MEN-METHYL SAL 0.5-0.035-5-20 % EX PTCH: 1.0000 | MEDICATED_PATCH | Freq: Every day | CUTANEOUS | Status: DC

## 2012-06-14 MED ORDER — HYDROCODONE-ACETAMINOPHEN 7.5-325 MG PO TABS: 1.0000 | ORAL_TABLET | Freq: Three times a day (TID) | ORAL | Status: DC

## 2012-06-14 NOTE — Patient Instructions (Addendum)
Fluids, rest  Return if worse.  See Dr Roanna Epley and associates.

## 2012-06-14 NOTE — Progress Notes (Addendum)
Subjective: Since late Saturday early Sunday patient is continued to have multiple symptoms. He has body aches, some congestion and cough, a sore throat in the morning, generalized malaise, nausea, and loose stools. His wife was sick last week but probably not with the flu. He has not had a flu shot. He works in Airline [employer].   Due to his seizure problems he has not gone back to his orthopedist. Dr. Ave Filter and told him to wait until the seizures were controlled and that he could decide what else he might offer for the shoulder. However the shoulder continues to hurt him limit his activity. We discussed the need for going back to the same position since he is been extensive surgery on it already.  Objective: Patient appears to not be feeling well. His TMs are normal. Throat not erythematous. Flu test was taken. The neck was supple without nodes. Chest is clear. Heart regular.  Assessment: Viral syndrome, rule out influenza  Plan: Flu test  Results for orders placed in visit on 06/14/12  POCT INFLUENZA A/B      Component Value Range   Influenza A, POC Negative     Influenza B, POC Negative     Will refer to sports medicine assess his shoulder which is giving her so much trouble.

## 2012-06-21 ENCOUNTER — Other Ambulatory Visit: Payer: Self-pay | Admitting: Family Medicine

## 2012-06-23 NOTE — Telephone Encounter (Signed)
I cannot keep giving pain meds.  Did he get the sports medicine appointment?  Check on it please.

## 2012-06-24 ENCOUNTER — Ambulatory Visit (INDEPENDENT_AMBULATORY_CARE_PROVIDER_SITE_OTHER): Payer: BC Managed Care – PPO | Admitting: Family Medicine

## 2012-06-24 VITALS — BP 129/90 | HR 92 | Temp 98.5°F | Resp 16 | Ht 72.75 in | Wt 260.6 lb

## 2012-06-24 DIAGNOSIS — G8929 Other chronic pain: Secondary | ICD-10-CM

## 2012-06-24 DIAGNOSIS — M25519 Pain in unspecified shoulder: Secondary | ICD-10-CM

## 2012-06-24 MED ORDER — HYDROCODONE-ACETAMINOPHEN 7.5-325 MG PO TABS: 1.0000 | ORAL_TABLET | Freq: Four times a day (QID) | ORAL | Status: DC | PRN

## 2012-06-24 NOTE — Progress Notes (Signed)
Subjective:    Patient ID: Derek Mckinney, male    DOB: 1981-03-11, 31 y.o.   MRN: [id number] Chief Complaint  Patient presents with  . med refills    pain meds    HPI  Derek Mckinney is a pleseant 31 yo man who is here today for additional naroctic medication. Was seen for the same just 10d prev by Dr. Quintella Reichert. Pt has a long-standing chronic shoulder pain for which he has been seen by several physicians. He had prev shoulder surgery by Dr. Ave Filter at [facility] but pt reports that he is not a surgical candidate until he is seizure free and since his surgery his pain has steadily worsened.  He was referred to Pain Management in the past since he is now on chronic narcotics for his shoulder pain but did not like [facility] - felt like he was in a "pill mill" - just gave him whatever narcotic he asked for and didn't actually try to treat him he felt.  Dr. Quintella Reichert, pt's main PCP, recently referred him to Dr. Darrick Penna at [facility] but pt reports the soonest appt he could get wasn't until next wk - and this was only with one of Dr. Jettie Booze' colleagues - would be mo before he could see Dr. Darrick Penna.  He has not yet scheduled an appointment - decided to think about it  Dr. Quintella Reichert rx'ed a topical pain patch (lido-capsaicin-men-methyl sal) recently but would cost him about $400/mo. Has tried icy hot and w/o relief. Does use tylenol and mobic in addition to vicodin 7.5mg  Pt reports that during his seizures, he freq suffers shoulder injury.  His seizures are being treated by neurology who also gives him narcotics for his shoulder (pt only admitted this when I told him I saw it from the controlled substances database).  Pt states he does not use the narcotics in a scheduled basis but can never go without - using 2 to 6 tabs daily depending on pain, sometimes needs only 2 tabs in a day.  He is out of medication today and he has had 60 pills filled in the past 30d (from Dr. Quintella Reichert and neurology) - averaging 6 tabs a day  and if he is only taking 2 on some days, then he must be up to 8 or 10 on others to average out - pt was very surprised at this.  Thinks that he could control his pain on average with 4 tabs/day.  He is reluctant to retry pain management due to his poor experience at [facility] but willing to give it a try if necessary.  Past Medical History  Diagnosis Date  . Seizures   . Depression   . Anxiety    Current Outpatient Prescriptions on File Prior to Visit  Medication Sig Dispense Refill  . acetaminophen (TYLENOL) 500 MG tablet Take 1,000 mg by mouth every 6 (six) hours as needed. pain      . desvenlafaxine (PRISTIQ) 100 MG 24 hr tablet Take 100 mg by mouth daily.      Marland Kitchen levETIRAcetam (KEPPRA) 750 MG tablet Take 750 mg by mouth 2 (two) times daily.      . meloxicam (MOBIC) 15 MG tablet Take 15 mg by mouth daily.      . traZODone (DESYREL) 100 MG tablet Take 100 mg by mouth at bedtime.      . ALPRAZolam (XANAX) 0.5 MG tablet Take 0.5 mg by mouth as needed. anxiety      . amphetamine-dextroamphetamine (  ADDERALL) 10 MG tablet Take 10 mg by mouth 2 (two) times daily.      . Lido-Capsaicin-Men-Methyl Sal (MEDI-PATCH-LIDOCAINE) 0.5-0.035-5-20 % PTCH Apply 1 patch topically at bedtime.  30 patch  0    Review of Systems  Constitutional: Negative for fever, chills, activity change, appetite change and unexpected weight change.  Gastrointestinal: Negative for abdominal pain, diarrhea and constipation.  Genitourinary: Negative for urgency, frequency, decreased urine volume and difficulty urinating.  Musculoskeletal: Positive for myalgias, back pain and arthralgias. Negative for gait problem.  Skin: Negative for color change, rash and wound.  Neurological: Positive for seizures, weakness and headaches. Negative for dizziness, light-headedness and numbness.  Psychiatric/Behavioral: Positive for sleep disturbance. The patient is nervous/anxious.        BP 129/90  Pulse 92  Temp 98.5 F (36.9 C) (Oral)   Resp 16  Ht 6' 0.75" (1.848 m)  Wt 260 lb 9.6 oz (118.207 kg)  BMI 34.62 kg/m2 Objective:   Physical Exam  Constitutional: He is oriented to person, place, and time. He appears well-developed and well-nourished. No distress.  HENT:  Head: Normocephalic and atraumatic.  Eyes: No scleral icterus.  Pulmonary/Chest: Effort normal.  Neurological: He is alert and oriented to person, place, and time.  Skin: Skin is warm and dry. He is not diaphoretic.  Psychiatric: He has a normal mood and affect. His behavior is normal.      Assessment & Plan:   1. Shoulder pain    2. Chronic shoulder pain  HYDROcodone-acetaminophen (NORCO) 7.5-325 MG per tablet, Ambulatory referral to Pain Clinic  I discussed below controlled substance policy in GREAT detail w/ pt.  I am quite concerned about the number of different narcotic prescriptions he is receiving each month from a variety of providers ([place], surgeons, neurology, ER, etc) as evidenced by 4 page [facility] report on narcotic rxs for this pt in the past yr.  This is my first time meeting the pt so I am not acquainted with his pain level, perception, other complicating factor, and nothing beyond the brief history reviewed but I am concerned that he is obtaining pain medications in a way that is often seen by those with narcotic addictions. However, it is possible that his chronic pain is undertreated leading him to visit a variety of providers.  Due to pt's prolonged history, we without a doubt know that he is going to be physically tolerant and dependent upon the medications if not also mentally.   Per the [facility] pt has used 60 tabs of vicodin 7.5 in the past 10d (30 from Dr. Alwyn Ren and 30 from his neurologist) and he is here today as he is out of medication.  Pt was surprised - didn't think he was using that much.  He states he truly does use it on a "as needed" basis - anywhere from 2 to 6 tabs daily. He thinks his pain can be controlled on 4 tabs/day.  I gave pt a  rx for an entire months worth of vicodin 7.5 at 4 tabs/day (=120 tabs, no refills.)  He is to NOT run out early and NOT receive any other fills from other providers. If he can comply with this, I will continue to provide him this medication until we can transition him to a pain clinic that can better suit his needs (NOT [facility]!).  If he does not f/u in 30d w/ an OV, uses more medicine than prescribed, or receives controlled substances from any other doctor, I  will no longer prescribe to him.  He NEEDS AN OV for any further narcotic fills and will need a UDS at that time.  Consider trying lidocaine patch at f/u - would be expensive but insurance will likely cover once a prior auth is completed due to failure of tylenol, mobic, and vicodin (and poss others). Consider asking neurology whether an addition of lyrica or gapapentin may be beneficial w/ szd in addition to pain control.  Perhaps augmenting w/ a TCA or cymbalta?? Discuss w/ pt at f/u. [facility] Policy for Prescribing Controlled Substances (Revised 05/2012) 1. Prescriptions for controlled substances will be filled by ONE provider at [facility] with whom you have established and developed a plan for your care, including follow-up. 2. You are encouraged to schedule an appointment with your prescriber at our appointment center for follow-up visits whenever possible. If you request a prescription for the controlled substance while at [facility] for an acute problem (with someone other than your regular prescriber), you MAY be given a ONE-TIME prescription for a 30-day supply of the controlled substance, to allow time for you to return to see your regular prescriber for additional prescriptions.  Over 25 minutes spent face-to-face in encounter discussing chronic pain co-morbidities and treatment.

## 2012-06-24 NOTE — Patient Instructions (Signed)
[facility] Policy for Prescribing Controlled Substances (Revised 05/2012) 1. Prescriptions for controlled substances will be filled by ONE provider at [facility] with whom you have established and developed a plan for your care, including follow-up. 2. You are encouraged to schedule an appointment with your prescriber at our appointment center for follow-up visits whenever possible. 3. If you request a prescription for the controlled substance while at [facility] for an acute problem (with someone other than your regular prescriber), you MAY be given a ONE-TIME prescription for a 30-day supply of the controlled substance, to allow time for you to return to see your regular prescriber for additional prescriptions. 

## 2012-06-24 NOTE — Telephone Encounter (Signed)
He has appt Jan 3rd with sports medicine clinic.

## 2012-06-25 ENCOUNTER — Other Ambulatory Visit: Payer: Self-pay | Admitting: Radiology

## 2012-06-25 NOTE — Telephone Encounter (Signed)
Called Rx in for him. Called him to advise he needs to make this last until his appt with sports medicine clinic. He states he does not need this, he was seen last pm and is being referred to pain management.

## 2012-07-09 ENCOUNTER — Ambulatory Visit: Payer: BC Managed Care – PPO | Admitting: Sports Medicine

## 2012-07-14 ENCOUNTER — Encounter: Payer: Self-pay | Admitting: Family Medicine

## 2012-08-05 ENCOUNTER — Emergency Department (HOSPITAL_COMMUNITY)
Admission: EM | Admit: 2012-08-05 | Discharge: 2012-08-05 | Disposition: A | Discharge status: Home / Self Care | Payer: BC Managed Care – PPO | Attending: Emergency Medicine | Admitting: Emergency Medicine

## 2012-08-05 ENCOUNTER — Encounter (HOSPITAL_COMMUNITY): Payer: Self-pay | Admitting: Emergency Medicine

## 2012-08-05 DIAGNOSIS — M79609 Pain in unspecified limb: Secondary | ICD-10-CM | POA: Insufficient documentation

## 2012-08-05 DIAGNOSIS — F411 Generalized anxiety disorder: Secondary | ICD-10-CM | POA: Insufficient documentation

## 2012-08-05 DIAGNOSIS — F3289 Other specified depressive episodes: Secondary | ICD-10-CM | POA: Insufficient documentation

## 2012-08-05 DIAGNOSIS — M25579 Pain in unspecified ankle and joints of unspecified foot: Secondary | ICD-10-CM | POA: Insufficient documentation

## 2012-08-05 DIAGNOSIS — R569 Unspecified convulsions: Secondary | ICD-10-CM

## 2012-08-05 DIAGNOSIS — F329 Major depressive disorder, single episode, unspecified: Secondary | ICD-10-CM | POA: Insufficient documentation

## 2012-08-05 DIAGNOSIS — Z79899 Other long term (current) drug therapy: Secondary | ICD-10-CM | POA: Insufficient documentation

## 2012-08-05 DIAGNOSIS — G40919 Epilepsy, unspecified, intractable, without status epilepticus: Secondary | ICD-10-CM | POA: Insufficient documentation

## 2012-08-05 DIAGNOSIS — Z791 Long term (current) use of non-steroidal anti-inflammatories (NSAID): Secondary | ICD-10-CM | POA: Insufficient documentation

## 2012-08-05 DIAGNOSIS — G4700 Insomnia, unspecified: Secondary | ICD-10-CM | POA: Insufficient documentation

## 2012-08-05 NOTE — ED Provider Notes (Signed)
History     CSN: [id number]  Arrival date & time 08/05/12  1021   First MD Initiated Contact with Patient 08/05/12 1044      Chief Complaint  Patient presents with  . Seizures    (Consider location/radiation/quality/duration/timing/severity/associated sxs/prior treatment) HPI The patient presents after a witnessed seizure.  On my exam the patient has recovered entirely.  He states that today's episode was similar to the events he has had multiple times recently, in spite of taking all medication as directed.  He states that last night he did not sleep much, which is atypical occurrence prior to seizures. History of present illness is also provided by the patient's companion. The patient denies pain.  He states that he takes all medication, including Keppra as directed.  He states that recently began to take Colchicine for gout.  He denies significant pain in his left ankle, where he had the flare of arthralgia last week.    Past Medical History  Diagnosis Date  . Seizures   . Depression   . Anxiety     Past Surgical History  Procedure Date  . Right shoulder surgery     2012  . Widom teeth extraction      as teenager  . Bankhardt repair 07/22/2011    Procedure: OPEN [name] REPAIR;  Surgeon: [name], MD;  Location: [facility];  Service: Orthopedics;  Laterality: Right;  right shoulder hardware removal, bone grafting of humeral head defect, open bankhardt repair C ARM  . Hardware removal 07/22/2011    Procedure: HARDWARE REMOVAL;  Surgeon: [name], MD;  Location: [facility];  Service: Orthopedics;  Laterality: Right;  . Fracture surgery     Family History  Problem Relation Age of Onset  . Hypothyroidism Mother   . Gout Father   . Lupus Father   . Hyperlipidemia Father   . Cancer Maternal Grandmother     lung cancer    History  Substance Use Topics  . Smoking status: Never Smoker   . Smokeless tobacco:  Current User    Types: Chew  . Alcohol Use: No      Review of Systems  Constitutional:       Per HPI, otherwise negative  HENT:       Per HPI, otherwise negative  Eyes: Negative.   Respiratory:       Per HPI, otherwise negative  Cardiovascular:       Per HPI, otherwise negative  Gastrointestinal: Negative for vomiting.  Genitourinary: Negative.   Musculoskeletal:       Per HPI, otherwise negative  Skin: Negative for wound.  Neurological: Positive for seizures. Negative for syncope, facial asymmetry, light-headedness and headaches.    Allergies  Penicillins  Home Medications   Current Outpatient Rx  Name  Route  Sig  Dispense  Refill  . ACETAMINOPHEN 500 MG PO TABS   Oral   Take 1,000 mg by mouth every 6 (six) hours as needed. pain         . COLCHICINE 0.6 MG PO TABS   Oral   Take 0.6 mg by mouth 2 (two) times daily as needed. For gout         . DESVENLAFAXINE SUCCINATE ER 100 MG PO TB24   Oral   Take 100 mg by mouth daily.         Marland Kitchen HYDROCODONE-ACETAMINOPHEN 7.5-325 MG PO TABS   Oral   Take 1 tablet by mouth every 6 (  six) hours as needed for pain.   120 tablet   0   . INDOMETHACIN 50 MG PO CAPS   Oral   Take 50 mg by mouth 2 (two) times daily with a meal.         . LEVETIRACETAM 750 MG PO TABS   Oral   Take 1,500 mg by mouth every morning.          . TRAZODONE HCL 100 MG PO TABS   Oral   Take 200 mg by mouth at bedtime.            BP 137/84  Pulse 106  Temp 98.1 F (36.7 C) (Oral)  Resp 18  SpO2 98%  Physical Exam  Nursing note and vitals reviewed. Constitutional: He is oriented to person, place, and time. He appears well-developed. No distress.  HENT:  Head: Normocephalic and atraumatic.       No tongue laceration, no TMJ tenderness  Eyes: Conjunctivae normal and EOM are normal.  Neck: Full passive range of motion without pain. No spinous process tenderness and no muscular tenderness present.  Cardiovascular: Normal rate and  regular rhythm.   Pulmonary/Chest: Effort normal. No stridor. No respiratory distress.  Abdominal: He exhibits no distension.  Musculoskeletal: He exhibits no edema.  Neurological: He is alert and oriented to person, place, and time.  Skin: Skin is warm and dry.  Psychiatric: He has a normal mood and affect.    ED Course  Procedures (including critical care time)  Labs Reviewed - No data to display No results found.   1. Seizure       MDM  This patient presents after a witnessed seizure.  The patient has a seizure history, and on my exam is fully recovered from his postictal phase, denying any new complaints.  We discussed at length, the need for ongoing neurologic evaluation, given his seizures in spite of taking all medication.  The patient's description of not sleeping last night, which is a typical precursor for seizure activity is somewhat reassuring.  The patient did not have labs, imaging, because there is no acute change in his condition, and he deferred these testing.  The patient was discharged in stable condition to followup with his neurologist.  Today, the patient was referred to his orthopedist given his recent episodes of arthralgia.      Gerhard Munch, MD 08/05/12 1105

## 2012-08-05 NOTE — ED Notes (Signed)
Bed:RESA<BR> Expected date:<BR> Expected time:<BR> Means of arrival:<BR> Comments:<BR> seizure

## 2012-08-05 NOTE — ED Notes (Signed)
Pt states has a hx of seizures, has been taking medications like scheduled, was at work when he fell out his chair and had a seizure, pt states he feels normal now and wants to go home. Pt denies pain, states back was tender on palpitation and having shoulder pain but hx of shoulder surgery and that is normal.

## 2012-08-05 NOTE — ED Notes (Signed)
Per EMS pt had a seizure at work witnessed by coworkers. Fell from seated position to the floor. Postictal, diaphoretic, and hypertensive. Complaints of shoulder pain bilateral and lumbar back pain that is chronic.

## 2012-08-06 ENCOUNTER — Ambulatory Visit: Payer: BC Managed Care – PPO | Admitting: Family Medicine

## 2012-08-16 ENCOUNTER — Emergency Department (HOSPITAL_COMMUNITY): Payer: BC Managed Care – PPO

## 2012-08-16 ENCOUNTER — Emergency Department (HOSPITAL_COMMUNITY)
Admission: EM | Admit: 2012-08-16 | Discharge: 2012-08-16 | Disposition: A | Discharge status: Home / Self Care | Payer: BC Managed Care – PPO | Attending: Emergency Medicine | Admitting: Emergency Medicine

## 2012-08-16 ENCOUNTER — Encounter (HOSPITAL_COMMUNITY): Payer: Self-pay

## 2012-08-16 DIAGNOSIS — G40909 Epilepsy, unspecified, not intractable, without status epilepticus: Secondary | ICD-10-CM | POA: Insufficient documentation

## 2012-08-16 DIAGNOSIS — F411 Generalized anxiety disorder: Secondary | ICD-10-CM | POA: Insufficient documentation

## 2012-08-16 DIAGNOSIS — R110 Nausea: Secondary | ICD-10-CM | POA: Insufficient documentation

## 2012-08-16 DIAGNOSIS — F3289 Other specified depressive episodes: Secondary | ICD-10-CM | POA: Insufficient documentation

## 2012-08-16 DIAGNOSIS — K5289 Other specified noninfective gastroenteritis and colitis: Secondary | ICD-10-CM | POA: Insufficient documentation

## 2012-08-16 DIAGNOSIS — F329 Major depressive disorder, single episode, unspecified: Secondary | ICD-10-CM | POA: Insufficient documentation

## 2012-08-16 DIAGNOSIS — R0789 Other chest pain: Secondary | ICD-10-CM | POA: Insufficient documentation

## 2012-08-16 DIAGNOSIS — Z79899 Other long term (current) drug therapy: Secondary | ICD-10-CM | POA: Insufficient documentation

## 2012-08-16 DIAGNOSIS — K529 Noninfective gastroenteritis and colitis, unspecified: Secondary | ICD-10-CM

## 2012-08-16 DIAGNOSIS — M549 Dorsalgia, unspecified: Secondary | ICD-10-CM | POA: Insufficient documentation

## 2012-08-16 LAB — CBC WITH DIFFERENTIAL/PLATELET
Hemoglobin: 14.9 g/dL (ref 13.0–17.0)
Lymphs Abs: 0.5 10*3/uL — ABNORMAL LOW (ref 0.7–4.0)
MCH: 28.5 pg (ref 26.0–34.0)
Monocytes Relative: 5 % (ref 3–12)
Neutro Abs: 7.4 10*3/uL (ref 1.7–7.7)
Neutrophils Relative %: 88 % — ABNORMAL HIGH (ref 43–77)
RBC: 5.23 MIL/uL (ref 4.22–5.81)

## 2012-08-16 LAB — COMPREHENSIVE METABOLIC PANEL
Alkaline Phosphatase: 107 U/L (ref 39–117)
BUN: 9 mg/dL (ref 6–23)
CO2: 23 mEq/L (ref 19–32)
Chloride: 97 mEq/L (ref 96–112)
GFR calc Af Amer: >90 mL/min (ref >90)
GFR calc non Af Amer: >90 mL/min (ref >90)
Glucose, Bld: 107 mg/dL — ABNORMAL HIGH (ref 70–99)
Potassium: 3.2 mEq/L — ABNORMAL LOW (ref 3.5–5.1)
Total Bilirubin: 0.5 mg/dL (ref 0.3–1.2)
Total Protein: 7.3 g/dL (ref 6.0–8.3)

## 2012-08-16 MED ORDER — ONDANSETRON HCL 8 MG PO TABS: 8.0000 mg | ORAL_TABLET | ORAL | Status: DC | PRN

## 2012-08-16 MED ORDER — ONDANSETRON HCL 4 MG/2ML IJ SOLN
4.0000 mg | Freq: Once | INTRAMUSCULAR | Status: AC
  Administered 2012-08-16: 4 mg via INTRAVENOUS
  Filled 2012-08-16: qty 2

## 2012-08-16 MED ORDER — KETOROLAC TROMETHAMINE 30 MG/ML IJ SOLN
30.0000 mg | Freq: Once | INTRAMUSCULAR | Status: AC
  Administered 2012-08-16: 30 mg via INTRAVENOUS
  Filled 2012-08-16: qty 1

## 2012-08-16 MED ORDER — SODIUM CHLORIDE 0.9 % IV BOLUS (SEPSIS)
1000.0000 mL | Freq: Once | INTRAVENOUS | Status: AC
  Administered 2012-08-16: 1000 mL via INTRAVENOUS

## 2012-08-16 NOTE — ED Provider Notes (Signed)
History     CSN: [id number]  Arrival date & time 08/16/12  1448   First MD Initiated Contact with Patient 08/16/12 1537      Chief Complaint  Patient presents with  . Shortness of Breath  . Back Pain  . Emesis    (Consider location/radiation/quality/duration/timing/severity/associated sxs/prior treatment) HPI Comments: Patient states that he has been having discomfort in the left side of his back since earlier this morning.  He believes he caught the stomach flu from his wife and vomited several times.  Shortly thereafter, he started with discomfort in the left upper back.  He also feels somewhat short of breath.  It is worse if he lies on the left side.  No cough or fever.    Patient is a 32 y.o. male presenting with shortness of breath. The history is provided by the patient.  Shortness of Breath Severity:  Moderate Onset quality:  Sudden Duration:  3 hours Timing:  Constant Progression:  Worsening Chronicity:  New Context comment:  Vomiting Relieved by:  Nothing Worsened by:  Nothing tried   Past Medical History  Diagnosis Date  . Seizures   . Depression   . Anxiety     Past Surgical History  Procedure Laterality Date  . Right shoulder surgery      2012  . Widom teeth extraction       as teenager  . Bankhardt repair  07/22/2011    Procedure: OPEN [name] REPAIR;  Surgeon: [name], MD;  Location: [facility];  Service: Orthopedics;  Laterality: Right;  right shoulder hardware removal, bone grafting of humeral head defect, open bankhardt repair C ARM  . Hardware removal  07/22/2011    Procedure: HARDWARE REMOVAL;  Surgeon: [name], MD;  Location: [facility];  Service: Orthopedics;  Laterality: Right;  . Fracture surgery      Family History  Problem Relation Age of Onset  . Hypothyroidism Mother   . Gout Father   . Lupus Father   . Hyperlipidemia Father   . Cancer Maternal Grandmother     lung  cancer    History  Substance Use Topics  . Smoking status: Never Smoker   . Smokeless tobacco: Current User    Types: Chew  . Alcohol Use: No      Review of Systems  Respiratory: Positive for shortness of breath.   All other systems reviewed and are negative.    Allergies  Penicillins  Home Medications   Current Outpatient Rx  Name  Route  Sig  Dispense  Refill  . acetaminophen (TYLENOL) 500 MG tablet   Oral   Take 1,000 mg by mouth every 6 (six) hours as needed. pain         . colchicine 0.6 MG tablet   Oral   Take 0.6 mg by mouth 2 (two) times daily as needed. For gout         . desvenlafaxine (PRISTIQ) 100 MG 24 hr tablet   Oral   Take 100 mg by mouth daily.         . indomethacin (INDOCIN) 50 MG capsule   Oral   Take 50 mg by mouth 2 (two) times daily as needed. For gout pain.         Marland Kitchen levETIRAcetam (KEPPRA) 750 MG tablet   Oral   Take 1,500 mg by mouth every morning.          . ondansetron (ZOFRAN-ODT) 8 MG disintegrating  tablet   Oral   Take 8 mg by mouth once.         . traZODone (DESYREL) 100 MG tablet   Oral   Take 200 mg by mouth at bedtime.            BP 123/84  Pulse 123  Temp(Src) 98.8 F (37.1 C) (Oral)  SpO2 98%  Physical Exam  Nursing note and vitals reviewed. Constitutional: He is oriented to person, place, and time. He appears well-developed and well-nourished. No distress.  HENT:  Head: Normocephalic and atraumatic.  Mouth/Throat: Oropharynx is clear and moist.  Neck: Normal range of motion. Neck supple.  Cardiovascular: Normal rate and regular rhythm.   No murmur heard. Pulmonary/Chest: Effort normal. No respiratory distress. He has no wheezes.  Abdominal: Soft. Bowel sounds are normal. He exhibits no distension. There is no tenderness.  Musculoskeletal: Normal range of motion. He exhibits no edema.  Lymphadenopathy:    He has no cervical adenopathy.  Neurological: He is alert and oriented to person, place,  and time.  Skin: Skin is warm and dry. He is not diaphoretic.    ED Course  Procedures (including critical care time)  Labs Reviewed  CBC WITH DIFFERENTIAL  COMPREHENSIVE METABOLIC PANEL   No results found.   No diagnosis found.    MDM  The patient presents with n/v/d and pain in the left chest after vomiting.  He was hydrated and medicated and is feeling better.  There is nothing on the chest xray to suggest boerhaave's or ptx.  Will discharge to home with zofran, return prn.        Geoffery Lyons, MD 08/16/12 [phone]

## 2012-08-16 NOTE — ED Notes (Signed)
Patient states that he had a seizure 10 days ago and fell out of a chair. Patient was brought to the ED and declined treatment. Patient states that his wife had a stomach flu with N/V. Patient states that he vomited approx 10 times in the past 24 hours. Patient took his wife's Zofran and has not had any further vomiting. Patient c/o increased back pain and SOB since the vomiting episode.

## 2012-08-27 ENCOUNTER — Other Ambulatory Visit: Payer: Self-pay | Admitting: Family Medicine

## 2012-08-28 ENCOUNTER — Encounter: Payer: Self-pay | Admitting: Neurology

## 2012-08-28 DIAGNOSIS — M79609 Pain in unspecified limb: Secondary | ICD-10-CM

## 2012-08-28 NOTE — Telephone Encounter (Signed)
Refill denied. Pt violated pain contract. I would recommend referral to a pain clinic but I will not refill any controlled substances for this pt.  He last received pain meds from his neurologist so can cont to do that or go to pain clinic.

## 2012-09-13 ENCOUNTER — Other Ambulatory Visit: Payer: Self-pay | Admitting: Family Medicine

## 2012-09-17 ENCOUNTER — Telehealth: Payer: Self-pay

## 2012-09-17 NOTE — Telephone Encounter (Signed)
Call:  I cannot refill this.  He needs to be seen to assess this, but I do not promise any chronic pain meds.  He was referred to a pain clinic 3 months ago.  What became of that?

## 2012-09-17 NOTE — Telephone Encounter (Signed)
[facility] sent fax for RF of pt's hydrocodone, BT-IBU 7.5-200, 1 tab Q 8 hr prn.  Dr Alwyn Ren, I'm forwarding this to you since you are pt's PCP, but see Dr Alver Fisher OV notes from last OV here.

## 2012-09-20 NOTE — Telephone Encounter (Signed)
Called patient to advise. Patient advised he should go to pain clinic for this.

## 2012-09-25 ENCOUNTER — Encounter (HOSPITAL_COMMUNITY): Payer: Self-pay | Admitting: *Deleted

## 2012-09-25 ENCOUNTER — Emergency Department (HOSPITAL_COMMUNITY)
Admission: EM | Admit: 2012-09-25 | Discharge: 2012-09-26 | Disposition: A | Discharge status: Home / Self Care | Payer: BC Managed Care – PPO | Attending: Emergency Medicine | Admitting: Emergency Medicine

## 2012-09-25 ENCOUNTER — Emergency Department (HOSPITAL_COMMUNITY): Payer: BC Managed Care – PPO

## 2012-09-25 DIAGNOSIS — M109 Gout, unspecified: Secondary | ICD-10-CM | POA: Insufficient documentation

## 2012-09-25 DIAGNOSIS — R4182 Altered mental status, unspecified: Secondary | ICD-10-CM | POA: Insufficient documentation

## 2012-09-25 DIAGNOSIS — F411 Generalized anxiety disorder: Secondary | ICD-10-CM | POA: Insufficient documentation

## 2012-09-25 DIAGNOSIS — G40909 Epilepsy, unspecified, not intractable, without status epilepticus: Secondary | ICD-10-CM | POA: Insufficient documentation

## 2012-09-25 DIAGNOSIS — R51 Headache: Secondary | ICD-10-CM | POA: Insufficient documentation

## 2012-09-25 DIAGNOSIS — F3289 Other specified depressive episodes: Secondary | ICD-10-CM | POA: Insufficient documentation

## 2012-09-25 DIAGNOSIS — R404 Transient alteration of awareness: Secondary | ICD-10-CM

## 2012-09-25 DIAGNOSIS — G4700 Insomnia, unspecified: Secondary | ICD-10-CM | POA: Insufficient documentation

## 2012-09-25 DIAGNOSIS — E669 Obesity, unspecified: Secondary | ICD-10-CM | POA: Insufficient documentation

## 2012-09-25 DIAGNOSIS — Z79899 Other long term (current) drug therapy: Secondary | ICD-10-CM | POA: Insufficient documentation

## 2012-09-25 DIAGNOSIS — F329 Major depressive disorder, single episode, unspecified: Secondary | ICD-10-CM | POA: Insufficient documentation

## 2012-09-25 LAB — RAPID URINE DRUG SCREEN, HOSP PERFORMED
Barbiturates: NOT DETECTED
Benzodiazepines: NOT DETECTED
Cocaine: NOT DETECTED
Opiates: NOT DETECTED
Tetrahydrocannabinol: NOT DETECTED

## 2012-09-25 LAB — ACETAMINOPHEN LEVEL: Acetaminophen (Tylenol), Serum: <15 ug/mL (ref 10–30)

## 2012-09-25 LAB — CBC
MCH: 28.9 pg (ref 26.0–34.0)
MCHC: 34.1 g/dL (ref 30.0–36.0)
Platelets: 259 10*3/uL (ref 150–400)
RDW: 13.6 % (ref 11.5–15.5)

## 2012-09-25 LAB — COMPREHENSIVE METABOLIC PANEL
ALT: 32 U/L (ref 0–53)
Albumin: 4 g/dL (ref 3.5–5.2)
Alkaline Phosphatase: 115 U/L (ref 39–117)
Calcium: 9.1 mg/dL (ref 8.4–10.5)
Potassium: 3.3 mEq/L — ABNORMAL LOW (ref 3.5–5.1)
Sodium: 135 mEq/L (ref 135–145)
Total Protein: 7.9 g/dL (ref 6.0–8.3)

## 2012-09-25 NOTE — ED Notes (Signed)
Pt denies SI and HI,  Pt is alert and oriented,  And is now released to go home

## 2012-09-25 NOTE — ED Provider Notes (Signed)
Medical screening examination/treatment/procedure(s) were performed by non-physician practitioner and as supervising physician I was immediately available for consultation/collaboration.   Nelia Shi, MD 09/25/12 2211

## 2012-09-25 NOTE — ED Provider Notes (Signed)
History    This chart was scribed for non-physician practitioner working with Nelia Shi, MD by Leone Payor, ED Scribe. This patient was seen in room [facility] and the patient's care was started at 1807.   CSN: [id number]  Arrival date & time 09/25/12  1807   None     Chief Complaint  Patient presents with  . Altered Mental Status     The history is provided by the patient and the spouse. No language interpreter was used.    Derek Mckinney is a 32 y.o. male who presents to the Emergency Department complaining of altered mental status today. Pt comes from home with his wife. Wife states pt has been restless all day and earlier tried to lay down and sleep. Wife heard pt talking to himself as though he was arguing with someone. Wife states pt was paranoid that she was going to use the phone. Episode lasted about 4 hrs. Resolved. Pt now his normal self. No hx of the same. Pt has hx of substance abuse with prescription pain meds and is currently in [facility] for treatment. Pt states he has been in treatment since 08/11/12. Pt denies changes in medications, dosage in medications, or the use of street drugs. Dr. Anne Hahn, his neurologist prescribes his home medications. Denies SI or HI.  Pt states he was having an out of body experience. States he remembers what happened. He c/o HA today and currently.   Pt has h/o seizures, depression, anxiety.  Pt denies smoking and alcohol use.  Past Medical History  Diagnosis Date  . Seizures   . Depression   . Anxiety   . Obesity   . Headache   . Chronic insomnia   . Gout     Past Surgical History  Procedure Laterality Date  . Right shoulder surgery      2012  . Widom teeth extraction       as teenager  . Bankhardt repair  07/22/2011    Procedure: OPEN [name] REPAIR;  Surgeon: [name], MD;  Location: [facility];  Service: Orthopedics;  Laterality: Right;  right shoulder hardware removal, bone grafting  of humeral head defect, open bankhardt repair C ARM  . Hardware removal  07/22/2011    Procedure: HARDWARE REMOVAL;  Surgeon: [name], MD;  Location: [facility];  Service: Orthopedics;  Laterality: Right;  . Fracture surgery      Family History  Problem Relation Age of Onset  . Hypothyroidism Mother   . Gout Father   . Lupus Father   . Hyperlipidemia Father   . Cancer Maternal Grandmother     lung cancer    History  Substance Use Topics  . Smoking status: Never Smoker   . Smokeless tobacco: Current User    Types: Chew  . Alcohol Use: No      Review of Systems  Neurological: Positive for headaches.  Psychiatric/Behavioral: Positive for hallucinations and confusion. Negative for suicidal ideas. The patient is nervous/anxious.   All other systems reviewed and are negative.    Allergies  Penicillins  Home Medications   Current Outpatient Rx  Name  Route  Sig  Dispense  Refill  . acetaminophen (TYLENOL) 500 MG tablet   Oral   Take 2,000 mg by mouth every 6 (six) hours as needed for pain. pain         . desvenlafaxine (PRISTIQ) 100 MG 24 hr tablet   Oral   Take  100 mg by mouth daily.         Marland Kitchen levETIRAcetam (KEPPRA) 750 MG tablet   Oral   Take 1,500 mg by mouth every morning.          . traZODone (DESYREL) 100 MG tablet   Oral   Take 200 mg by mouth at bedtime.            BP 127/86  Pulse 99  Temp(Src) 99 F (37.2 C) (Oral)  Resp 20  SpO2 92%  Physical Exam  Nursing note and vitals reviewed. Constitutional: He is oriented to person, place, and time. He appears well-developed and well-nourished. No distress.  HENT:  Head: Normocephalic and atraumatic.  Eyes: EOM are normal.  Neck: Neck supple. No tracheal deviation present.  Cardiovascular: Normal rate, regular rhythm and normal heart sounds.   Pulmonary/Chest: Effort normal and breath sounds normal. No respiratory distress.  Musculoskeletal: Normal range of  motion.  Neurological: He is alert and oriented to person, place, and time.  Has mild tremors.   Skin: Skin is warm and dry.  Psychiatric: He has a normal mood and affect. His behavior is normal.    ED Course  Procedures (including critical care time)  DIAGNOSTIC STUDIES: Oxygen Saturation is 92% on room air, low by my interpretation.    COORDINATION OF CARE: 7:25 PM Discussed treatment plan with pt at bedside and pt agreed to plan.    Results for orders placed during the hospital encounter of 09/25/12  ACETAMINOPHEN LEVEL      Result Value Range   Acetaminophen (Tylenol), Serum <15.0  10 - 30 ug/mL  CBC      Result Value Range   WBC 12.6 (*) 4.0 - 10.5 K/uL   RBC 5.22  4.22 - 5.81 MIL/uL   Hemoglobin 15.1  13.0 - 17.0 g/dL   HCT 14.7  82.9 - 56.2 %   MCV 84.9  78.0 - 100.0 fL   MCH 28.9  26.0 - 34.0 pg   MCHC 34.1  30.0 - 36.0 g/dL   RDW 13.0  86.5 - 78.4 %   Platelets 259  150 - 400 K/uL  COMPREHENSIVE METABOLIC PANEL      Result Value Range   Sodium 135  135 - 145 mEq/L   Potassium 3.3 (*) 3.5 - 5.1 mEq/L   Chloride 100  96 - 112 mEq/L   CO2 22  19 - 32 mEq/L   Glucose, Bld 92  70 - 99 mg/dL   BUN 8  6 - 23 mg/dL   Creatinine, Ser 6.96  0.50 - 1.35 mg/dL   Calcium 9.1  8.4 - 29.5 mg/dL   Total Protein 7.9  6.0 - 8.3 g/dL   Albumin 4.0  3.5 - 5.2 g/dL   AST 17  0 - 37 U/L   ALT 32  0 - 53 U/L   Alkaline Phosphatase 115  39 - 117 U/L   Total Bilirubin 0.4  0.3 - 1.2 mg/dL   GFR calc non Af Amer 86 (*) >90 mL/min   GFR calc Af Amer >90  >90 mL/min  ETHANOL      Result Value Range   Alcohol, Ethyl (B) <11  0 - 11 mg/dL  SALICYLATE LEVEL      Result Value Range   Salicylate Lvl <2.0 (*) 2.8 - 20.0 mg/dL   Ct Head Wo Contrast  09/25/2012  *RADIOLOGY REPORT*  Clinical Data: Altered mental status  CT HEAD WITHOUT CONTRAST  Technique:  Contiguous axial images were obtained from the base of the skull through the vertex without contrast.  Comparison: None.  Findings:  Ventricle size is normal.  No acute infarct.  Negative for hemorrhage or mass.  11 mm rounded cyst in the right lower basal ganglia most likely a perivascular space or benign cyst. Calvarium intact.  IMPRESSION: No acute abnormality.   Original Report Authenticated By: Janeece Riggers, M.D.       No diagnosis found.    MDM  Pt with possible psychotic episode earlier today. He is back to baseline per wife. He appears normal to me. Currently complaining of mild headache. Discussed with Dr. Radford Pax. Will get labs, CT head, and telepsych.   9:17 PM Pt does not want to wait for telepsych. He again denies SI or HI. He is in no distress. Has been in ER for 3 hrs, and he has been normal. No neuro deficits. Normal exam. Pt has a psychiatrist at the [facility] who he will see on Monday. Instructed to return if worsening.   Filed Vitals:   09/25/12 1819  BP: 127/86  Pulse: 99  Temp: 99 F (37.2 C)  TempSrc: Oral  Resp: 20  SpO2: 92%       I personally performed the services described in this documentation, which was scribed in my presence. The recorded information has been reviewed and is accurate.   Lottie Mussel, PA-C 09/25/12 2120

## 2012-09-25 NOTE — ED Notes (Signed)
Pt comes from home with his wife.  Pt's wife states pt has been restless all day and earlier tried to lay down and sleep.  Wife heard pt talking to himself - as though he was arguing with someone.  Pt stated, "I'm not going to do that, stop telling me to do that."  When asked about this behavior, pt states it was as though "I had to beat the tunnel."  Pt states there was a fight in the tunnel.  Wife states pt was paranoid that she was going to use the phone.  Pt has hx of substance abuse with prescription pain meds and is currently in [facility] for treatment.  Pt states he has been in treatment since 08/11/12.  Pt states he relapsed on 2/26 with Hydrocodone and Ambien.  Pt denies current use of any medications not prescribed to him.  Pt denies SI/HI and states the voices he heard earlier were not telling him to hurt himself or anyone else.  Pt c/o headache and states he has had a headache "since I found out I was coming here."  Pt's wife is tearful and states pt has never behaved this way before.  Pt also c/o involuntary movements in his feet and legs.

## 2012-09-28 ENCOUNTER — Ambulatory Visit: Payer: BC Managed Care – PPO | Admitting: Neurology

## 2012-10-01 ENCOUNTER — Emergency Department (HOSPITAL_COMMUNITY): Payer: BC Managed Care – PPO

## 2012-10-01 ENCOUNTER — Encounter (HOSPITAL_COMMUNITY): Payer: Self-pay | Admitting: *Deleted

## 2012-10-01 ENCOUNTER — Emergency Department (HOSPITAL_COMMUNITY)
Admission: EM | Admit: 2012-10-01 | Discharge: 2012-10-01 | Disposition: A | Discharge status: Home / Self Care | Payer: BC Managed Care – PPO | Attending: Emergency Medicine | Admitting: Emergency Medicine

## 2012-10-01 DIAGNOSIS — Y9239 Other specified sports and athletic area as the place of occurrence of the external cause: Secondary | ICD-10-CM | POA: Insufficient documentation

## 2012-10-01 DIAGNOSIS — Z8669 Personal history of other diseases of the nervous system and sense organs: Secondary | ICD-10-CM | POA: Insufficient documentation

## 2012-10-01 DIAGNOSIS — F329 Major depressive disorder, single episode, unspecified: Secondary | ICD-10-CM | POA: Insufficient documentation

## 2012-10-01 DIAGNOSIS — G40909 Epilepsy, unspecified, not intractable, without status epilepticus: Secondary | ICD-10-CM | POA: Insufficient documentation

## 2012-10-01 DIAGNOSIS — Z79899 Other long term (current) drug therapy: Secondary | ICD-10-CM | POA: Insufficient documentation

## 2012-10-01 DIAGNOSIS — Y9389 Activity, other specified: Secondary | ICD-10-CM | POA: Insufficient documentation

## 2012-10-01 DIAGNOSIS — Y9366 Activity, soccer: Secondary | ICD-10-CM | POA: Insufficient documentation

## 2012-10-01 DIAGNOSIS — Z87828 Personal history of other (healed) physical injury and trauma: Secondary | ICD-10-CM | POA: Insufficient documentation

## 2012-10-01 DIAGNOSIS — Z9889 Other specified postprocedural states: Secondary | ICD-10-CM | POA: Insufficient documentation

## 2012-10-01 DIAGNOSIS — E669 Obesity, unspecified: Secondary | ICD-10-CM | POA: Insufficient documentation

## 2012-10-01 DIAGNOSIS — F3289 Other specified depressive episodes: Secondary | ICD-10-CM | POA: Insufficient documentation

## 2012-10-01 DIAGNOSIS — S46911A Strain of unspecified muscle, fascia and tendon at shoulder and upper arm level, right arm, initial encounter: Secondary | ICD-10-CM

## 2012-10-01 DIAGNOSIS — X500XXA Overexertion from strenuous movement or load, initial encounter: Secondary | ICD-10-CM | POA: Insufficient documentation

## 2012-10-01 DIAGNOSIS — Z8639 Personal history of other endocrine, nutritional and metabolic disease: Secondary | ICD-10-CM | POA: Insufficient documentation

## 2012-10-01 DIAGNOSIS — F411 Generalized anxiety disorder: Secondary | ICD-10-CM | POA: Insufficient documentation

## 2012-10-01 DIAGNOSIS — IMO0002 Reserved for concepts with insufficient information to code with codable children: Secondary | ICD-10-CM | POA: Insufficient documentation

## 2012-10-01 DIAGNOSIS — Z862 Personal history of diseases of the blood and blood-forming organs and certain disorders involving the immune mechanism: Secondary | ICD-10-CM | POA: Insufficient documentation

## 2012-10-01 MED ORDER — HYDROCODONE-ACETAMINOPHEN 5-325 MG PO TABS: 2.0000 | ORAL_TABLET | ORAL | Status: DC | PRN

## 2012-10-01 NOTE — ED Notes (Signed)
Pt sts he is in drug rehab and does not want to be given any narcotics prescriptions. He sts he is ok with having pain meds if his shoulder needs to be put back in place but nothing other than that.

## 2012-10-01 NOTE — ED Notes (Signed)
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 Pt reports hx of dislocated shoulder and surgery for same done last year. Pt sts pain with moving.

## 2012-10-01 NOTE — ED Provider Notes (Signed)
History     CSN: [id number]  Arrival date & time 10/01/12  1858   First MD Initiated Contact with Patient 10/01/12 1925      Chief Complaint  Patient presents with  . Shoulder Pain    (Consider location/radiation/quality/duration/timing/severity/associated sxs/prior treatment) HPI Comments: Patient comes to the ER for evaluation of right shoulder injury. Patient is concerned that he might have dislocated his shoulder because he has had multiple shoulder dislocations. 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 He's been having severe pain in the shoulder with movement ever since.  Patient is a 32 y.o. male presenting with shoulder pain.  Shoulder Pain    Past Medical History  Diagnosis Date  . Seizures   . Depression   . Anxiety   . Obesity   . Headache   . Chronic insomnia   . Gout     Past Surgical History  Procedure Laterality Date  . Right shoulder surgery      2012  . Widom teeth extraction       as teenager  . Bankhardt repair  07/22/2011    Procedure: OPEN [name] REPAIR;  Surgeon: [name], MD;  Location: [facility];  Service: Orthopedics;  Laterality: Right;  right shoulder hardware removal, bone grafting of humeral head defect, open bankhardt repair C ARM  . Hardware removal  07/22/2011    Procedure: HARDWARE REMOVAL;  Surgeon: [name], MD;  Location: [facility];  Service: Orthopedics;  Laterality: Right;  . Fracture surgery      Family History  Problem Relation Age of Onset  . Hypothyroidism Mother   . Gout Father   . Lupus Father   . Hyperlipidemia Father   . Cancer Maternal Grandmother     lung cancer    History  Substance Use Topics  . Smoking status: Never Smoker   . Smokeless tobacco: Current User    Types: Chew  . Alcohol Use: No      Review of Systems  Musculoskeletal: Positive  for arthralgias.  All other systems reviewed and are negative.    Allergies  Penicillins  Home Medications   Current Outpatient Rx  Name  Route  Sig  Dispense  Refill  . acetaminophen (TYLENOL) 500 MG tablet   Oral   Take 2,000 mg by mouth every 6 (six) hours as needed for pain. pain         . desvenlafaxine (PRISTIQ) 100 MG 24 hr tablet   Oral   Take 100 mg by mouth daily.         Marland Kitchen levETIRAcetam (KEPPRA) 750 MG tablet   Oral   Take 1,500 mg by mouth every morning.          . traZODone (DESYREL) 100 MG tablet   Oral   Take 200 mg by mouth at bedtime.            BP 153/111  Pulse 107  Temp(Src) 98.1 F (36.7 C) (Oral)  Resp 18  SpO2 97%  Physical Exam  Musculoskeletal:       Right shoulder: He exhibits tenderness. He exhibits normal range of motion, no swelling and no deformity.    ED Course  Procedures (including critical care time)  Labs Reviewed - No data to display Dg Shoulder Right  10/01/2012  *RADIOLOGY REPORT*  Clinical Data:  Right shoulder pain, injured playing  soccer  RIGHT SHOULDER - 2+ VIEW  Comparison: 05/17/2012  Findings: Two cannulated screws and stable postoperative changes at proximal humerus. Osseous demineralization. AC joint alignment normal. No acute fracture, dislocation, or bone destruction. Visualized right ribs intact.  IMPRESSION: Postoperative changes at proximal right humerus. Osseous demineralization. No acute abnormalities.   Original Report Authenticated By: Ulyses Southward, M.D.      Diagnosis: Right shoulder strain    MDM  Patient comes to the ER complaint of right shoulder pain. Patient is concerned about the possibility of dislocation because he has had multiple dislocations in the past. Examination, however, did not show any obvious deformity his range of motion is not impaired. X-ray confirms that there is no dislocation or acute fracture. Patient is treated with analgesia and rest, followup with his orthopedic  surgeon.        Gilda Crease, MD 10/01/12 2016

## 2012-10-01 NOTE — ED Notes (Signed)
Patient is alert and oriented x3.  He was given DC instructions and follow up visit instructions.  Patient gave verbal understanding.  He was DC ambulatory under his own power to home.  V/S stable.  He was not showing any signs of distress on DC 

## 2012-10-07 ENCOUNTER — Other Ambulatory Visit: Payer: Self-pay | Admitting: Neurology

## 2012-10-15 ENCOUNTER — Encounter: Payer: Self-pay | Admitting: Physician Assistant

## 2012-10-15 DIAGNOSIS — F1120 Opioid dependence, uncomplicated: Secondary | ICD-10-CM

## 2012-10-15 DIAGNOSIS — R569 Unspecified convulsions: Secondary | ICD-10-CM

## 2012-11-01 ENCOUNTER — Other Ambulatory Visit: Payer: Self-pay | Admitting: Family Medicine

## 2012-11-01 ENCOUNTER — Other Ambulatory Visit: Payer: Self-pay | Admitting: Neurology

## 2012-11-23 ENCOUNTER — Emergency Department (HOSPITAL_COMMUNITY)
Admission: EM | Admit: 2012-11-23 | Discharge: 2012-11-24 | Disposition: A | Discharge status: Home / Self Care | Payer: BC Managed Care – PPO | Attending: Emergency Medicine | Admitting: Emergency Medicine

## 2012-11-23 ENCOUNTER — Encounter (HOSPITAL_COMMUNITY): Payer: Self-pay | Admitting: *Deleted

## 2012-11-23 DIAGNOSIS — Y9389 Activity, other specified: Secondary | ICD-10-CM | POA: Insufficient documentation

## 2012-11-23 DIAGNOSIS — S43016A Anterior dislocation of unspecified humerus, initial encounter: Secondary | ICD-10-CM | POA: Insufficient documentation

## 2012-11-23 DIAGNOSIS — Z862 Personal history of diseases of the blood and blood-forming organs and certain disorders involving the immune mechanism: Secondary | ICD-10-CM | POA: Insufficient documentation

## 2012-11-23 DIAGNOSIS — G40909 Epilepsy, unspecified, not intractable, without status epilepticus: Secondary | ICD-10-CM | POA: Insufficient documentation

## 2012-11-23 DIAGNOSIS — Y929 Unspecified place or not applicable: Secondary | ICD-10-CM | POA: Insufficient documentation

## 2012-11-23 DIAGNOSIS — Z9889 Other specified postprocedural states: Secondary | ICD-10-CM | POA: Insufficient documentation

## 2012-11-23 DIAGNOSIS — Z8639 Personal history of other endocrine, nutritional and metabolic disease: Secondary | ICD-10-CM | POA: Insufficient documentation

## 2012-11-23 DIAGNOSIS — E669 Obesity, unspecified: Secondary | ICD-10-CM | POA: Insufficient documentation

## 2012-11-23 DIAGNOSIS — R569 Unspecified convulsions: Secondary | ICD-10-CM

## 2012-11-23 DIAGNOSIS — F3289 Other specified depressive episodes: Secondary | ICD-10-CM | POA: Insufficient documentation

## 2012-11-23 DIAGNOSIS — S43005A Unspecified dislocation of left shoulder joint, initial encounter: Secondary | ICD-10-CM

## 2012-11-23 DIAGNOSIS — Z79899 Other long term (current) drug therapy: Secondary | ICD-10-CM | POA: Insufficient documentation

## 2012-11-23 DIAGNOSIS — F329 Major depressive disorder, single episode, unspecified: Secondary | ICD-10-CM | POA: Insufficient documentation

## 2012-11-23 DIAGNOSIS — G4700 Insomnia, unspecified: Secondary | ICD-10-CM | POA: Insufficient documentation

## 2012-11-23 DIAGNOSIS — Z880 Allergy status to penicillin: Secondary | ICD-10-CM | POA: Insufficient documentation

## 2012-11-23 DIAGNOSIS — F411 Generalized anxiety disorder: Secondary | ICD-10-CM | POA: Insufficient documentation

## 2012-11-23 DIAGNOSIS — W06XXXA Fall from bed, initial encounter: Secondary | ICD-10-CM | POA: Insufficient documentation

## 2012-11-23 NOTE — ED Notes (Addendum)
Pt to ER via EMS for complaint of seizure; pt reports that he has not had a seizure in 3-4 months; pt is on Keppra for seizures; pt c/o left shoulder pain reports that he struck his shoulder on the nightstand during seizure; pt also states that he bit his lip during seizure and has an abrasion to rt knee.

## 2012-11-23 NOTE — ED Notes (Signed)
[id number]<AV> Expected date:<BR> Expected time:<BR> Means of arrival:<BR> Comments:<BR> EMS/seizure

## 2012-11-24 ENCOUNTER — Emergency Department (HOSPITAL_COMMUNITY): Payer: BC Managed Care – PPO

## 2012-11-24 MED ORDER — DIAZEPAM 5 MG/ML IJ SOLN
2.5000 mg | Freq: Once | INTRAMUSCULAR | Status: AC
  Administered 2012-11-24: 2.5 mg via INTRAVENOUS
  Filled 2012-11-24: qty 2

## 2012-11-24 MED ORDER — OXYCODONE-ACETAMINOPHEN 5-325 MG PO TABS: 2.0000 | ORAL_TABLET | Freq: Four times a day (QID) | ORAL | Status: DC | PRN

## 2012-11-24 MED ORDER — ETOMIDATE 2 MG/ML IV SOLN
INTRAVENOUS | Status: AC | PRN
  Administered 2012-11-24: 12 mg via INTRAVENOUS

## 2012-11-24 MED ORDER — ETOMIDATE 2 MG/ML IV SOLN
INTRAVENOUS | Status: AC
  Filled 2012-11-24: qty 10

## 2012-11-24 MED ORDER — FENTANYL CITRATE 0.05 MG/ML IJ SOLN
100.0000 ug | Freq: Once | INTRAMUSCULAR | Status: AC
  Administered 2012-11-24: 100 ug via INTRAVENOUS
  Filled 2012-11-24: qty 2

## 2012-11-24 MED ORDER — OXYCODONE-ACETAMINOPHEN 5-325 MG PO TABS
2.0000 | ORAL_TABLET | Freq: Once | ORAL | Status: AC
  Administered 2012-11-24: 2 via ORAL
  Filled 2012-11-24: qty 2

## 2012-11-24 NOTE — ED Provider Notes (Addendum)
History     CSN: [id number]  Arrival date & time 11/23/12  2341   First MD Initiated Contact with Patient 11/24/12 0001      Chief Complaint  Patient presents with  . Seizures    (Consider location/radiation/quality/duration/timing/severity/associated sxs/prior treatment) HPI 32 year old male presents from home via EMS with reported seizure.  Patient has history of seizures.  Last seizure about 3 years ago.  Patient is on Keppra.  He denies missing any doses.  He does admit to slightly decreased sleep recently.  Patient also has history of narcotic abuse.  He reports that he has been clean for last 90 days.  Patient reports when he fell out of bed with his seizure he scraped his right knee, and felt that he dislocated his left shoulder.  Patient has history of multiple dislocations of both shoulders, status post Bankart repair on the right.  He is followed by Dr. Ave Filter with orthopedics and Dr. Anne Hahn with neurology.  Past Medical History  Diagnosis Date  . Seizures   . Depression   . Anxiety   . Obesity   . Headache   . Chronic insomnia   . Gout     Past Surgical History  Procedure Laterality Date  . Right shoulder surgery      2012  . Widom teeth extraction       as teenager  . Bankhardt repair  07/22/2011    Procedure: OPEN [name] REPAIR;  Surgeon: [name], MD;  Location: [facility];  Service: Orthopedics;  Laterality: Right;  right shoulder hardware removal, bone grafting of humeral head defect, open bankhardt repair C ARM  . Hardware removal  07/22/2011    Procedure: HARDWARE REMOVAL;  Surgeon: [name], MD;  Location: [facility];  Service: Orthopedics;  Laterality: Right;  . Fracture surgery      Family History  Problem Relation Age of Onset  . Hypothyroidism Mother   . Gout Father   . Lupus Father   . Hyperlipidemia Father   . Cancer Maternal Grandmother     lung cancer    History  Substance  Use Topics  . Smoking status: Never Smoker   . Smokeless tobacco: Current User    Types: Chew  . Alcohol Use: No      Review of Systems  See History of Present Illness; otherwise all other systems are reviewed and negative Allergies  Penicillins  Home Medications   Current Outpatient Rx  Name  Route  Sig  Dispense  Refill  . acetaminophen (TYLENOL) 500 MG tablet   Oral   Take 2,000 mg by mouth every 6 (six) hours as needed for pain. pain         . levETIRAcetam (KEPPRA) 750 MG tablet   Oral   Take 1,500 mg by mouth every morning.          . traZODone (DESYREL) 100 MG tablet   Oral   Take 200 mg by mouth at bedtime.            BP 146/99  Pulse 109  Temp(Src) 99 F (37.2 C) (Oral)  Resp 24  SpO2 95%  Physical Exam  Nursing note and vitals reviewed. Constitutional: He is oriented to person, place, and time. He appears well-developed and well-nourished. He appears distressed (uncomfortable appearing).  HENT:  Head: Normocephalic and atraumatic.  Right Ear: External ear normal.  Left Ear: External ear normal.  Nose: Nose normal.  Mouth/Throat: Oropharynx is clear  and moist.  Patient reports pain to right side of tongue, no open lacerations noted  Eyes: Conjunctivae and EOM are normal. Pupils are equal, round, and reactive to light.  Neck: Normal range of motion. Neck supple. No JVD present. No tracheal deviation present. No thyromegaly present.  Cardiovascular: Normal rate, regular rhythm, normal heart sounds and intact distal pulses.  Exam reveals no gallop and no friction rub.   No murmur heard. Pulmonary/Chest: Effort normal and breath sounds normal. No stridor. No respiratory distress. He has no wheezes. He has no rales. He exhibits no tenderness.  Abdominal: Soft. Bowel sounds are normal. He exhibits no distension and no mass. There is no tenderness. There is no rebound and no guarding.  Musculoskeletal: Normal range of motion. He exhibits tenderness  (tenderness and deformity noted to left shoulder). He exhibits no edema.  Evette Cristal is distally neurovascularly intact from his dislocated left shoulder  Lymphadenopathy:    He has no cervical adenopathy.  Neurological: He is alert and oriented to person, place, and time. He has normal reflexes. No cranial nerve deficit. He exhibits normal muscle tone. Coordination normal.  Skin: Skin is warm and dry. No rash noted. No erythema. No pallor.  Psychiatric: He has a normal mood and affect. His behavior is normal. Judgment and thought content normal.    ED Course  Reduction of dislocation Date/Time: 11/24/2012 1:35 AM Performed by: Olivia Mackie Authorized by: Olivia Mackie Consent: Verbal consent obtained. Risks and benefits: risks, benefits and alternatives were discussed Consent given by: patient Patient understanding: patient states understanding of the procedure being performed Patient consent: the patient's understanding of the procedure matches consent given Procedure consent: procedure consent matches procedure scheduled Relevant documents: relevant documents present and verified Test results: test results available and properly labeled Site marked: the operative site was marked Imaging studies: imaging studies available Required items: required blood products, implants, devices, and special equipment available Patient identity confirmed: verbally with patient Time out: Immediately prior to procedure a "time out" was called to verify the correct patient, procedure, equipment, support staff and site/side marked as required. Local anesthesia used: no Patient sedated: yes Sedation type: moderate (conscious) sedation Sedatives: etomidate Vitals: Vital signs were monitored during sedation. Patient tolerance: Patient tolerated the procedure well with no immediate complications. Comments: Reduction of left shoulder begun after the deltoid massage.  Patient was gently externally rotated and arm  abducted up and over the head.  Reduction of the shoulder with this maneuver.  Patient tolerated procedure well   (including critical care time)  Labs Reviewed - No data to display Dg Shoulder Left  11/24/2012   *RADIOLOGY REPORT*  Clinical Data: Seizure.  Fall.  Shoulder pain and deformity.  LEFT SHOULDER - 2+ VIEW  Comparison: None.  Findings: Anterior dislocation of the humeral head is seen.  No definite fracture or other significant bone abnormality identified.  IMPRESSION: Anterior dislocation of the shoulder.  No evidence of fracture.   Original Report Authenticated By: Myles Rosenthal, M.D.   Dg Shoulder Left Port  11/24/2012   *RADIOLOGY REPORT*  Clinical Data: Post reduction films.  PORTABLE LEFT SHOULDER - 2+ VIEW  Comparison: Earlier films, same date.  Findings: Interval reduction of the left humeral head dislocation. Suspect a Hill-Sachs impaction type fracture involving the humeral head.  IMPRESSION: Reduction of left humeral head dislocation.   Original Report Authenticated By: Rudie Meyer, M.D.    Date: 11/24/2012  Rate: 138  Rhythm: sinus tachycardia  QRS Axis: normal  Intervals: normal  ST/T Wave abnormalities: normal  Conduction Disutrbances:none  Narrative Interpretation:   Old EKG Reviewed: unchanged    1. Seizure   2. Shoulder dislocation, left, initial encounter       MDM  32 year old male status post seizure, with left shoulder dislocation.  Shoulder easily reduced under conscious sedation.  Unclear cause of seizure, but he denies missing any doses of his Keppra.  He has had a good dosing schedule of his Keppra.  Patient is requesting home pain medicine, which he says that his wife: Optician, dispensing, given his past history of narcotic abuse.  We'll have him followup with his neurologist and orthopedist.       Olivia Mackie, MD 11/24/12 0302  Olivia Mackie, MD 11/24/12 [phone]

## 2012-11-24 NOTE — ED Notes (Signed)
Patient transported to X-ray 

## 2013-01-16 ENCOUNTER — Ambulatory Visit (INDEPENDENT_AMBULATORY_CARE_PROVIDER_SITE_OTHER): Payer: BC Managed Care – PPO | Admitting: Internal Medicine

## 2013-01-16 VITALS — BP 126/89 | HR 89 | Temp 98.3°F | Resp 16 | Ht 73.0 in | Wt 273.0 lb

## 2013-01-16 DIAGNOSIS — R569 Unspecified convulsions: Secondary | ICD-10-CM

## 2013-01-16 NOTE — Progress Notes (Signed)
  Subjective:    Patient ID: Derek Mckinney, male    DOB: [birth year], 32 y.o.   MRN: [id number]  HPI He suffered a seizure on Friday and missed work. He was up most of Thursday night with anxiety and sleep deprivation usually precipitates seizures despite Keppra. Denies any recent medication misuse Neurology-Dr. Anne Hahn Trazodone 200 mg at bedtime but was still unable to sleep last week on Thursday night  Patient Active Problem List   Diagnosis Date Noted  . Opiate addiction 10/15/2012  . Insomnia 08/05/2012  . Pain in limb 08/05/2012  . Chronic shoulder pain 05/18/2012  . Seizures 03/29/2012       Review of Systems     Objective:   Physical Exam BP 126/89  Pulse 89  Temp(Src) 98.3 F (36.8 C) (Oral)  Resp 16  Ht 6\' 1"  (1.854 m)  Wt 273 lb (123.832 kg)  BMI 36.03 kg/m2  SpO2 99% HEENT clear Neurological appears intact grossly Mood is stable and affect appropriate       Assessment & Plan:  History of seizure disorder  Should be able to resume work without any restrictions Tomorrow and followup with neurology as planned

## 2013-01-30 ENCOUNTER — Ambulatory Visit (INDEPENDENT_AMBULATORY_CARE_PROVIDER_SITE_OTHER): Payer: BC Managed Care – PPO | Admitting: Internal Medicine

## 2013-01-30 VITALS — BP 130/90 | HR 90 | Temp 98.4°F | Resp 18 | Ht 73.0 in | Wt 269.4 lb

## 2013-01-30 DIAGNOSIS — M25512 Pain in left shoulder: Secondary | ICD-10-CM

## 2013-01-30 DIAGNOSIS — R569 Unspecified convulsions: Secondary | ICD-10-CM

## 2013-01-30 DIAGNOSIS — M25519 Pain in unspecified shoulder: Secondary | ICD-10-CM

## 2013-01-30 MED ORDER — OXYCODONE-ACETAMINOPHEN 5-325 MG PO TABS: 2.0000 | ORAL_TABLET | Freq: Four times a day (QID) | ORAL | Status: DC | PRN

## 2013-01-30 NOTE — Progress Notes (Signed)
  Subjective:    Patient ID: Derek Mckinney, male    DOB: April 12, 1981, 32 y.o.   MRN: [id number]  HPIanother seizure on thurs while slleping--had to miss Friday work Usually sleep deprivation or stress at work precipitate seizures. This is only 2nd nocturnal seizure. Changed from depakote to keppra 6 mos ago Dr Anne Hahn primary neurologist  Chart review reveals history of problems with opiates in the past. He denies any current problems. Denies any recent use of anything that was not prescribed.  R shoulder surgery 1/13 Dr Chandler--recurrent dislocations with seizures. Now with pain for days after each seizure. Unclear what the clinical plan is.  Review of Systems  Constitutional: Negative for fever, activity change, appetite change, fatigue and unexpected weight change.  HENT: Negative for hearing loss and neck pain.   Eyes: Negative for visual disturbance.  Respiratory: Negative for chest tightness and shortness of breath.   Cardiovascular: Negative for chest pain and palpitations.  Neurological: Negative for dizziness, tremors and light-headedness.  Psychiatric/Behavioral: Positive for sleep disturbance. Negative for confusion, dysphoric mood and decreased concentration.       Describes poor sleeping habits with sleep deprivation precipitating seizures       Objective:   Physical Exam  Constitutional: He is oriented to person, place, and time. He appears well-developed and well-nourished.  HENT:  Head: Normocephalic and atraumatic.  Eyes: Conjunctivae and EOM are normal. Pupils are equal, round, and reactive to light.  Neck: No thyromegaly present.  Cardiovascular: Normal rate and regular rhythm.   Musculoskeletal: He exhibits no edema.  Neurological: He is alert and oriented to person, place, and time. He has normal reflexes. No cranial nerve deficit.  Psychiatric: He has a normal mood and affect. His behavior is normal.  BP 130/90  Pulse 90  Temp(Src) 98.4 F (36.9 C) (Oral)   Resp 18  Ht 6\' 1"  (1.854 m)  Wt 269 lb 6.4 oz (122.199 kg)  BMI 35.55 kg/m2  SpO2 98% Both shoulders have pain with range of motion, particularly the left. There is a surgical scar on the right and fairly good range of motion. The left has decreased range of motion with pain on resisted movements. There is mild crepitus with movement.        Assessment & Plan:  Pain in joint, shoulder region, left  Seizures - Plan: Ambulatory referral to Neurology  He obviously needs followup with neurology for better stabilization the seizures and he may well need lifestyle changes with counseling to rearrange some of the precipitating factors although he denies any recent problems with drugs  Meds ordered this encounter  Medications  . oxyCODONE-acetaminophen (PERCOCET/ROXICET) 5-325 MG per tablet    Sig: Take 2 tablets by mouth every 6 (six) hours as needed for pain.    Dispense:  20 tablet    Refill:  0   Note for rtw

## 2013-01-31 ENCOUNTER — Telehealth: Payer: Self-pay | Admitting: *Deleted

## 2013-02-03 ENCOUNTER — Telehealth: Payer: Self-pay | Admitting: Neurology

## 2013-02-04 ENCOUNTER — Telehealth: Payer: Self-pay | Admitting: Neurology

## 2013-02-04 MED ORDER — LORAZEPAM 1 MG PO TABS: 1.0000 mg | ORAL_TABLET | Freq: Three times a day (TID) | ORAL | Status: DC | PRN

## 2013-02-04 NOTE — Telephone Encounter (Signed)
I called patient. The patient is still having seizures. The patient is on Vimpat and Keppra, and he finds that he has not missed doses. The patient will need a revisit seen. This may be an adequate candidate for video EEG monitoring. The patient appears to be uncontrolled on several seizure medications. I am concerned this patient in the past has had a substance abuse problem. The patient claims that he feels that he is going to have seizure today. I will call in a small prescription for Ativan.

## 2013-02-04 NOTE — Telephone Encounter (Signed)
Pt called states he has had 2-3 seizures within the past month, went to urgent care last Sat. And they advised him they would call Dr. Anne Hahn to get an apt set up but don't see any call that has come in. I don't see anything until Oct can someone please call pt he felt like he was fixing to have another one and needs to get in today or if Dr. Anne Hahn can call in some rescue medicine until he can be seen. Thanks

## 2013-02-04 NOTE — Telephone Encounter (Signed)
Patient wants to be seen before Oct due to having seizures. Please advise.

## 2013-02-11 ENCOUNTER — Other Ambulatory Visit: Payer: Self-pay | Admitting: Neurology

## 2013-02-11 NOTE — Telephone Encounter (Signed)
Rx signed and faxed.

## 2013-02-18 ENCOUNTER — Encounter (HOSPITAL_COMMUNITY): Payer: Self-pay | Admitting: Emergency Medicine

## 2013-02-18 ENCOUNTER — Emergency Department (HOSPITAL_COMMUNITY): Payer: BC Managed Care – PPO

## 2013-02-18 ENCOUNTER — Emergency Department (HOSPITAL_COMMUNITY)
Admission: EM | Admit: 2013-02-18 | Discharge: 2013-02-18 | Disposition: A | Discharge status: Home / Self Care | Payer: BC Managed Care – PPO | Attending: Emergency Medicine | Admitting: Emergency Medicine

## 2013-02-18 ENCOUNTER — Other Ambulatory Visit: Payer: Self-pay | Admitting: Neurology

## 2013-02-18 DIAGNOSIS — M25511 Pain in right shoulder: Secondary | ICD-10-CM

## 2013-02-18 DIAGNOSIS — S4980XA Other specified injuries of shoulder and upper arm, unspecified arm, initial encounter: Secondary | ICD-10-CM | POA: Insufficient documentation

## 2013-02-18 DIAGNOSIS — E669 Obesity, unspecified: Secondary | ICD-10-CM | POA: Insufficient documentation

## 2013-02-18 DIAGNOSIS — Z8639 Personal history of other endocrine, nutritional and metabolic disease: Secondary | ICD-10-CM | POA: Insufficient documentation

## 2013-02-18 DIAGNOSIS — F3289 Other specified depressive episodes: Secondary | ICD-10-CM | POA: Insufficient documentation

## 2013-02-18 DIAGNOSIS — Y939 Activity, unspecified: Secondary | ICD-10-CM | POA: Insufficient documentation

## 2013-02-18 DIAGNOSIS — G40909 Epilepsy, unspecified, not intractable, without status epilepticus: Secondary | ICD-10-CM | POA: Insufficient documentation

## 2013-02-18 DIAGNOSIS — W19XXXA Unspecified fall, initial encounter: Secondary | ICD-10-CM

## 2013-02-18 DIAGNOSIS — S46909A Unspecified injury of unspecified muscle, fascia and tendon at shoulder and upper arm level, unspecified arm, initial encounter: Secondary | ICD-10-CM | POA: Insufficient documentation

## 2013-02-18 DIAGNOSIS — W010XXA Fall on same level from slipping, tripping and stumbling without subsequent striking against object, initial encounter: Secondary | ICD-10-CM | POA: Insufficient documentation

## 2013-02-18 DIAGNOSIS — F411 Generalized anxiety disorder: Secondary | ICD-10-CM | POA: Insufficient documentation

## 2013-02-18 DIAGNOSIS — Z880 Allergy status to penicillin: Secondary | ICD-10-CM | POA: Insufficient documentation

## 2013-02-18 DIAGNOSIS — F329 Major depressive disorder, single episode, unspecified: Secondary | ICD-10-CM | POA: Insufficient documentation

## 2013-02-18 DIAGNOSIS — Z862 Personal history of diseases of the blood and blood-forming organs and certain disorders involving the immune mechanism: Secondary | ICD-10-CM | POA: Insufficient documentation

## 2013-02-18 DIAGNOSIS — Y92009 Unspecified place in unspecified non-institutional (private) residence as the place of occurrence of the external cause: Secondary | ICD-10-CM | POA: Insufficient documentation

## 2013-02-18 DIAGNOSIS — Z79899 Other long term (current) drug therapy: Secondary | ICD-10-CM | POA: Insufficient documentation

## 2013-02-18 LAB — RAPID URINE DRUG SCREEN, HOSP PERFORMED: Barbiturates: NOT DETECTED

## 2013-02-18 NOTE — ED Provider Notes (Signed)
CSN: [id number]     Arrival date & time 02/18/13  2022 History  This chart was scribed for non-physician practitioner, Ivonne Andrew, PA-C working with Audree Camel, MD by Greggory Stallion, ED scribe. This patient was seen in room WTR9/WTR9 and the patient's care was started at 9:27 PM.   Chief Complaint  Patient presents with  . Medical Clearance   The history is provided by the patient. No language interpreter was used.   HPI Comments: Derek Mckinney is a 32 y.o. male who presents to the Emergency Department for medication clearance. Pt is complaining of sudden onset, constant right shoulder pain that started earlier tonight due to slipping on water today. Pt states he has had two surgeries on his right shoulder. Pt denies numbness as associated symptoms. Denies any change in range of motion per her baseline. He also states he has a history of drug addiction and his wife believes he is using again so he is requesting a drug screen to prove to her that he is not. Pt states he does not have heart problems, HTN, or diabetes.   Orthopaedist is Dr. Ave Filter   Past Medical History  Diagnosis Date  . Seizures   . Depression   . Anxiety   . Obesity   . Headache(784.0)   . Chronic insomnia   . Gout    Past Surgical History  Procedure Laterality Date  . Right shoulder surgery      2012  . Widom teeth extraction       as teenager  . Bankart repair  07/22/2011    Procedure: OPEN [name] REPAIR;  Surgeon: [name], MD;  Location: [facility];  Service: Orthopedics;  Laterality: Right;  right shoulder hardware removal, bone grafting of humeral head defect, open bankhardt repair C ARM  . Hardware removal  07/22/2011    Procedure: HARDWARE REMOVAL;  Surgeon: [name], MD;  Location: [facility];  Service: Orthopedics;  Laterality: Right;  . Fracture surgery     Family History  Problem Relation Age of Onset  . Hypothyroidism Mother    . Gout Father   . Lupus Father   . Hyperlipidemia Father   . Cancer Maternal Grandmother     lung cancer   History  Substance Use Topics  . Smoking status: Never Smoker   . Smokeless tobacco: Current User    Types: Chew  . Alcohol Use: No    Review of Systems  Musculoskeletal: Positive for arthralgias.  Neurological: Negative for numbness.  All other systems reviewed and are negative.    Allergies  Penicillins  Home Medications   Current Outpatient Rx  Name  Route  Sig  Dispense  Refill  . acetaminophen (TYLENOL) 500 MG tablet   Oral   Take 2,000 mg by mouth every 6 (six) hours as needed for pain. pain         . levETIRAcetam (KEPPRA) 750 MG tablet   Oral   Take 1,500 mg by mouth 2 (two) times daily.          Marland Kitchen LORazepam (ATIVAN) 1 MG tablet      TAKE 1 TABLET BY MOUTH 3 TIMES A DAY AS NEEDED   10 tablet   0     Pharmacy Fax [phone]   . oxyCODONE-acetaminophen (PERCOCET/ROXICET) 5-325 MG per tablet   Oral   Take 2 tablets by mouth every 6 (six) hours as needed for pain.   20 tablet  0   . traZODone (DESYREL) 100 MG tablet   Oral   Take 200 mg by mouth at bedtime.           BP 122/92  Pulse 120  Temp(Src) 99.1 F (37.3 C) (Oral)  Resp 18  Ht 6\' 2"  (1.88 m)  Wt 275 lb (124.739 kg)  BMI 35.29 kg/m2  SpO2 100%  Physical Exam  Nursing note and vitals reviewed. Constitutional: He is oriented to person, place, and time. He appears well-developed and well-nourished.  HENT:  Head: Normocephalic and atraumatic.  Mouth/Throat: Oropharynx is clear and moist.  Eyes: Conjunctivae and EOM are normal.  Neck: Normal range of motion.  Cardiovascular: Normal rate, regular rhythm and normal heart sounds.   Pulmonary/Chest: Effort normal and breath sounds normal. No respiratory distress.  Abdominal: Soft.  Musculoskeletal: Normal range of motion.  Reduced ROM in right shoulder. This was reported to be similar to his baseline post surgery. There is a  large anterior surgical scar consistent with his history of other surgery. There was a small old appearing bruise to lateral part of the arm just below the deltoid.   Neurological: He is alert and oriented to person, place, and time.  Neurovascularly intact distally.   Skin: Skin is warm and dry.  Psychiatric: He has a normal mood and affect.    ED Course   Procedures   DIAGNOSTIC STUDIES: Oxygen Saturation is 100% on RA, normal by my interpretation.    COORDINATION OF CARE: 9:39 PM-Discussed treatment plan which includes xray, antiinflammatories for his shoulder and UA with pt at bedside and pt agreed to plan. Advised pt he can follow up with his orthopaedist for his shoulder.   Labs Reviewed  URINE RAPID DRUG SCREEN (HOSP PERFORMED) - Abnormal; Notable for the following:    Benzodiazepines POSITIVE (*)    All other components within normal limits   Dg Shoulder Right  02/18/2013   *RADIOLOGY REPORT*  Clinical Data: 32 year old male with pain status post fall. History of right humerus bone grafting/surgery.  RIGHT SHOULDER - 2+ VIEW  Comparison: 10/01/2012 and earlier.  Findings: Chronic deformity and postoperative changes to the proximal right humerus including two cannulated screws appear stable. No glenohumeral joint dislocation.  No acute fracture of the proximal humerus.  Scapula intact.  Right clavicle intact. Visible right ribs and lung parenchyma stable.  Degenerative sclerosis of the right glenoid may have increased.  IMPRESSION: No acute fracture or dislocation identified about the right shoulder.  Chronic and postoperative changes again noted.   Original Report Authenticated By: Erskine Speed, M.D.   1. Fall, initial encounter   2. Shoulder pain, acute, right     MDM  Patient seen and evaluated. Patient appears well no acute distress. He does have some reduced range of motion the right shoulder but this is baseline per his history. No significant concerning findings on exam a  localized.  Patient also requesting a drug screen to to his past history of opioid use and issues with trust with his family. I did discuss that this was not the best use of the emergency room resources however agree to evaluate his drug screen as well as his injuries from his fall.  X-rays unremarkable. I discussed to use [facility]. I also discussed drug screening findings. He will followup with PCP for evaluation and treatment.     I personally performed the services described in this documentation, which was scribed in my presence. The recorded information has been  reviewed and is accurate.   Angus Seller, PA-C 02/18/13 2240

## 2013-02-18 NOTE — ED Notes (Signed)
Patient transported to X-ray 

## 2013-02-18 NOTE — ED Notes (Signed)
Patient states while at home today, slipped on water and fell onto R shoulder, pt states he has a hx addiction in past wife believes he is using again. Pt is requesting drug screen and to have shoulder evaluated.

## 2013-02-19 NOTE — ED Provider Notes (Signed)
Medical screening examination/treatment/procedure(s) were performed by non-physician practitioner and as supervising physician I was immediately available for consultation/collaboration.   Audree Camel, MD 02/19/13 [phone]

## 2013-03-16 ENCOUNTER — Other Ambulatory Visit: Payer: Self-pay | Admitting: Neurology

## 2013-03-18 ENCOUNTER — Telehealth: Payer: Self-pay | Admitting: Neurology

## 2013-03-18 NOTE — Telephone Encounter (Signed)
Dr Anne Hahn, would you like to fill Ativan.  (Patient No Showed appt on 03/25 and has not rescheduled)

## 2013-03-18 NOTE — Telephone Encounter (Signed)
This patient has a history of diazepam and benzodiazepine abuse. I will not refill the Ativan, he needs a revisit.

## 2013-04-06 ENCOUNTER — Ambulatory Visit (INDEPENDENT_AMBULATORY_CARE_PROVIDER_SITE_OTHER): Payer: BC Managed Care – PPO | Admitting: Family Medicine

## 2013-04-06 ENCOUNTER — Telehealth: Payer: Self-pay | Admitting: Neurology

## 2013-04-06 VITALS — BP 138/88 | HR 85 | Temp 98.0°F | Resp 16 | Ht 74.0 in | Wt 260.0 lb

## 2013-04-06 DIAGNOSIS — R569 Unspecified convulsions: Secondary | ICD-10-CM

## 2013-04-06 NOTE — Telephone Encounter (Signed)
This patient will need a revisit, may see NP.
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
## 2013-04-06 NOTE — Telephone Encounter (Signed)
I spoke to patient and he said he had a seizure yesterday and is having one every 2-3 months.  He wants an appointment.  FYI the patient was a no show for appt 09-28-12 and for an EEG appt on 08-12-12.  He was last seen 08-05-12.  Please advise.  [phone]

## 2013-04-07 ENCOUNTER — Other Ambulatory Visit: Payer: Self-pay | Admitting: Neurology

## 2013-04-07 NOTE — Telephone Encounter (Signed)
Noted in patient's chart he has a history of Benzodiazapine abuse.  He is not to get any narcotic refills, per Dr. Anne Hahn.  Please review his chart for his history.

## 2013-04-08 ENCOUNTER — Encounter: Payer: Self-pay | Admitting: Neurology

## 2013-04-08 ENCOUNTER — Ambulatory Visit (INDEPENDENT_AMBULATORY_CARE_PROVIDER_SITE_OTHER): Payer: BC Managed Care – PPO | Admitting: Neurology

## 2013-04-08 VITALS — BP 142/100 | HR 89 | Ht 74.0 in | Wt 262.0 lb

## 2013-04-08 DIAGNOSIS — R569 Unspecified convulsions: Secondary | ICD-10-CM

## 2013-04-08 DIAGNOSIS — Z5181 Encounter for therapeutic drug level monitoring: Secondary | ICD-10-CM

## 2013-04-08 DIAGNOSIS — R079 Chest pain, unspecified: Secondary | ICD-10-CM

## 2013-04-08 DIAGNOSIS — M545 Low back pain: Secondary | ICD-10-CM

## 2013-04-08 MED ORDER — OXYCODONE-ACETAMINOPHEN 5-325 MG PO TABS: 2.0000 | ORAL_TABLET | Freq: Four times a day (QID) | ORAL | Status: DC | PRN

## 2013-04-08 MED ORDER — ZONISAMIDE 25 MG PO CAPS: ORAL_CAPSULE | ORAL | Status: DC

## 2013-04-08 NOTE — Progress Notes (Signed)
Reason for visit: Seizures  Derek Mckinney is an 32 y.o. male  History of present illness:  Derek Mckinney is a 32 year old right-handed white male with a history of intractable seizures. The patient indicates that he had a recent seizure 3 days ago, and he has developed some right sided rib pain and low back pain following the seizure. The patient is on Keppra, and he is off Vimpat. The patient is on 1500 mg of Keppra twice daily. The patient indicates that he has not missed any doses. The patient indicates that he has bifrontal headaches. There is some indication that he has had some issues of over-using benzodiazepines, Adderall, and opiate medications. The patient has been getting small prescriptions for lorazepam, and Percocet. The patient returns to the office today for an evaluation.  Past Medical History  Diagnosis Date  . Seizures   . Depression   . Anxiety   . Obesity   . Headache(784.0)   . Chronic insomnia   . Gout   . Polysubstance abuse     Past Surgical History  Procedure Laterality Date  . Right shoulder surgery      2012  . Widom teeth extraction       as teenager  . Bankart repair  07/22/2011    Procedure: OPEN [name] REPAIR;  Surgeon: [name], MD;  Location: [facility];  Service: Orthopedics;  Laterality: Right;  right shoulder hardware removal, bone grafting of humeral head defect, open bankhardt repair C ARM  . Hardware removal  07/22/2011    Procedure: HARDWARE REMOVAL;  Surgeon: [name], MD;  Location: [facility];  Service: Orthopedics;  Laterality: Right;  . Fracture surgery      Family History  Problem Relation Age of Onset  . Hypothyroidism Mother   . Gout Father   . Lupus Father   . Hyperlipidemia Father   . Cancer Maternal Grandmother     lung cancer    Social history:  reports that he has never smoked. His smokeless tobacco use includes Chew. He reports that he does not drink  alcohol or use illicit drugs.    Allergies  Allergen Reactions  . Penicillins Anaphylaxis    Medications:  Current Outpatient Prescriptions on File Prior to Visit  Medication Sig Dispense Refill  . acetaminophen (TYLENOL) 500 MG tablet Take 2,000 mg by mouth every 6 (six) hours as needed for pain. pain      . levETIRAcetam (KEPPRA) 750 MG tablet Take 1,500 mg by mouth 2 (two) times daily.       Marland Kitchen LORazepam (ATIVAN) 1 MG tablet TAKE 1 TABLET BY MOUTH 3 TIMES A DAY AS NEEDED  10 tablet  0   No current facility-administered medications on file prior to visit.    ROS:  Out of a complete 14 system review of symptoms, the patient complains only of the following symptoms, and all other reviewed systems are negative.  Fatigue Joint pain, achy muscles Memory loss, headache, seizures Insomnia  Blood pressure 142/100, pulse 89, height 6\' 2"  (1.88 m), weight 262 lb (118.842 kg).  Physical Exam  General: The patient is alert and cooperative at the time of the examination. The patient is moderately obese.  Neuromuscular: The patient has incomplete ability to abduct the right arm, full mobility with the left arm.  Skin: No significant peripheral edema is noted.   Neurologic Exam  Cranial nerves: Facial symmetry is present. Speech is normal, no aphasia or dysarthria  is noted. Extraocular movements are full. Visual fields are full.  Motor: The patient has good strength in all 4 extremities.  Coordination: The patient has good finger-nose-finger and heel-to-shin bilaterally.  Gait and station: The patient has a normal gait. Tandem gait is normal. Romberg is negative. No drift is seen.  Reflexes: Deep tendon reflexes are symmetric.   Assessment/Plan:  1. Seizures, intractable  2. Headache  3. Possible polysubstance abuse  The patient will need to be restricted with his use of benzodiazepines, and opiates. The patient was given a very small prescription for Percocet today  secondary to his low back pain and rib pain following a seizure. The patient will be set up for x-rays of the chest looking for rib fractures, and of the low back looking for compression fractures. The patient will be placed on Zonegran, working up to 75 milligrams twice daily. After one month of therapy, the patient will be placed on 100 mg twice daily. A Keppra level will be checked. If the seizures continue, evaluation for video EEG monitoring will be done. The patient is to have a repeat EEG study. The patient followup in 6 months. The patient is not to operate a motor vehicle.  Marlan Palau MD 04/09/2013 7:55 AM  [facility] [address] [place], [place] [address]  Phone [phone] Fax [phone]

## 2013-04-09 ENCOUNTER — Other Ambulatory Visit: Payer: Self-pay | Admitting: Neurology

## 2013-04-11 ENCOUNTER — Telehealth: Payer: Self-pay

## 2013-04-11 MED ORDER — LORAZEPAM 1 MG PO TABS: 1.0000 mg | ORAL_TABLET | Freq: Two times a day (BID) | ORAL | Status: DC | PRN

## 2013-04-11 NOTE — Telephone Encounter (Signed)
Patient called, said he was told we would continue prescribing Ativan for him.  Dr Anne Hahn, would you like to prescribe?  Please advise.  Thank you.

## 2013-04-11 NOTE — Telephone Encounter (Signed)
This patient likely has a history of benzodiazepine abuse. We have in the past giving him small prescription for this medication. I will call in 10 tablets. This must last 28 days.

## 2013-04-12 ENCOUNTER — Other Ambulatory Visit: Payer: BC Managed Care – PPO

## 2013-04-13 ENCOUNTER — Telehealth: Payer: Self-pay

## 2013-04-13 NOTE — Telephone Encounter (Signed)
I called the patient. The patient wants another prescription for Percocet. The patient indicated that he has low back pain and some pain in the right rib cage following a seizure. X-rays were ordered, but he just hasn't gotten around to getting x-rays done yet. I will not reorder the Percocet. The patient was given a small prescription for a few days. The patient has a history of opiate and benzodiazepine overuse.

## 2013-04-13 NOTE — Telephone Encounter (Signed)
Patient called stating he needs another Rx for Percocet.  Would you like to fill?  Please advise.  Thank you.

## 2013-05-06 ENCOUNTER — Other Ambulatory Visit: Payer: Self-pay | Admitting: *Deleted

## 2013-05-06 DIAGNOSIS — R569 Unspecified convulsions: Secondary | ICD-10-CM

## 2013-06-03 ENCOUNTER — Other Ambulatory Visit: Payer: Self-pay | Admitting: Neurology

## 2013-07-19 ENCOUNTER — Ambulatory Visit (INDEPENDENT_AMBULATORY_CARE_PROVIDER_SITE_OTHER): Payer: BC Managed Care – PPO | Admitting: Family Medicine

## 2013-07-19 VITALS — BP 138/96 | HR 135 | Temp 101.6°F | Resp 20 | Ht 73.25 in | Wt 277.0 lb

## 2013-07-19 DIAGNOSIS — J111 Influenza due to unidentified influenza virus with other respiratory manifestations: Secondary | ICD-10-CM

## 2013-07-19 DIAGNOSIS — F1120 Opioid dependence, uncomplicated: Secondary | ICD-10-CM

## 2013-07-19 DIAGNOSIS — R6889 Other general symptoms and signs: Secondary | ICD-10-CM

## 2013-07-19 DIAGNOSIS — Z131 Encounter for screening for diabetes mellitus: Secondary | ICD-10-CM

## 2013-07-19 LAB — POCT CBC
GRANULOCYTE PERCENT: 80 % (ref 37–80)
HEMATOCRIT: 47.1 % (ref 43.5–53.7)
HEMOGLOBIN: 14.7 g/dL (ref 14.1–18.1)
Lymph, poc: 0.8 (ref 0.6–3.4)
MCH, POC: 28.2 pg (ref 27–31.2)
MCHC: 31.2 g/dL — AB (ref 31.8–35.4)
MCV: 90.3 fL (ref 80–97)
MID (cbc): 0.7 (ref 0–0.9)
MPV: 7.3 fL (ref 0–99.8)
POC GRANULOCYTE: 5.9 (ref 2–6.9)
POC LYMPH PERCENT: 10.2 %L (ref 10–50)
POC MID %: 9.8 %M (ref 0–12)
Platelet Count, POC: 240 10*3/uL (ref 142–424)
RBC: 5.22 M/uL (ref 4.69–6.13)
RDW, POC: 15.3 %
WBC: 7.4 10*3/uL (ref 4.6–10.2)

## 2013-07-19 LAB — POCT INFLUENZA A/B
INFLUENZA A, POC: NEGATIVE
Influenza B, POC: NEGATIVE

## 2013-07-19 LAB — GLUCOSE, POCT (MANUAL RESULT ENTRY): POC GLUCOSE: 119 mg/dL — AB (ref 70–99)

## 2013-07-19 LAB — POCT RAPID STREP A (OFFICE): RAPID STREP A SCREEN: NEGATIVE

## 2013-07-19 MED ORDER — OSELTAMIVIR PHOSPHATE 75 MG PO CAPS: 75.0000 mg | ORAL_CAPSULE | Freq: Two times a day (BID) | ORAL | Status: DC

## 2013-07-19 MED ORDER — IPRATROPIUM BROMIDE 0.03 % NA SOLN: 2.0000 | Freq: Two times a day (BID) | NASAL | Status: DC

## 2013-07-19 MED ORDER — HYDROCODONE-HOMATROPINE 5-1.5 MG/5ML PO SYRP: 5.0000 mL | ORAL_SOLUTION | Freq: Four times a day (QID) | ORAL | Status: DC | PRN

## 2013-07-19 NOTE — Progress Notes (Addendum)
This chart was scribed for Ethelda ChickKristi M Dayon Witt, MD by Joaquin MusicKristina Sanchez-Matthews, ED Scribe. This patient was seen in room Room/bed 11 and the patient's care was started at 8:32 AM. Subjective:    Patient ID: Derek Mckinney, male    DOB: 1980-09-08, 33 y.o.   MRN: [id number] Chief Complaint  Patient presents with  . Headache    x 3 days   . Cough    x 3 days   . Sore Throat    x 3 days  . Generalized Body Aches    x 3 days  . Fever    x 2 days 100.7 degrees   HPI Derek Mckinney is a 33 y.o. male who [facility] complaining of ongoing HA, cough, sore throat, myalgias, and fever with max temp of 100.7 that began 2 days ago with associated chills, diaphoresis, and slight otalgia. He states he has been mildly SOB lately compared to normal. Pt states he has been taking OTC Tylenol with little relief. Pt denies smoking. Pt denies DM and having any heart conditions. He states he has been taking seizure medications but states he was diagnosed with pseudo seizures under stress conditions and states he is still receiving [facility] with counseling. Pt denies otalgia, nausea, vomiting, and diarrhea. No flu vaccine this season.  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  Wife currently 7 months pregnant; also has a 33 year old child at home and a mother-in-law with DMII.  Active Ambulatory Problems    Diagnosis Date Noted  . Seizures 03/29/2012  . Chronic shoulder pain 05/18/2012  . Insomnia 08/05/2012  . Pain in limb 08/05/2012  . Opiate addiction 10/15/2012   Resolved Ambulatory Problems    Diagnosis Date Noted  . No Resolved Ambulatory Problems   Past Medical History  Diagnosis Date  . Depression   . Anxiety   . Obesity   . Headache(784.0)   . Chronic insomnia   . Gout   . Polysubstance abuse    Allergies  Allergen Reactions  . Penicillins Anaphylaxis   Current outpatient prescriptions:acetaminophen (TYLENOL) 500 MG tablet, Take 2,000 mg by mouth every 6  (six) hours as needed for pain. pain, Disp: , Rfl: ;  sertraline (ZOLOFT) 50 MG tablet, Take 50 mg by mouth daily., Disp: , Rfl: ;  traZODone (DESYREL) 100 MG tablet, TAKE TWO TABLETS BY MOUTH EVERY NIGHT, Disp: 60 tablet, Rfl: 4;  Eszopiclone (ESZOPICLONE) 3 MG TABS, Take 3 mg by mouth at bedtime. Take immediately before bedtime, Disp: , Rfl:  HYDROcodone-homatropine (HYCODAN) 5-1.5 MG/5ML syrup, Take 5 mLs by mouth every 6 (six) hours as needed for cough., Disp: 180 mL, Rfl: 0;  ipratropium (ATROVENT) 0.03 % nasal spray, Place 2 sprays into the nose 2 (two) times daily., Disp: 30 mL, Rfl: 0;  levETIRAcetam (KEPPRA) 750 MG tablet, Take 1,500 mg by mouth 2 (two) times daily. , Disp: , Rfl:  LORazepam (ATIVAN) 1 MG tablet, Take 1 tablet (1 mg total) by mouth 2 (two) times daily as needed for anxiety (Must last 28 days)., Disp: 10 tablet, Rfl: 0;  oseltamivir (TAMIFLU) 75 MG capsule, Take 1 capsule (75 mg total) by mouth 2 (two) times daily., Disp: 10 capsule, Rfl: 0;  oxyCODONE-acetaminophen (PERCOCET/ROXICET) 5-325 MG per tablet, Take 2 tablets by mouth every 6 (six) hours as needed for pain., Disp: 20 tablet, Rfl: 0 zonisamide (ZONEGRAN) 25 MG capsule, One tablet twice a day for one week, then  take two tablets twice a day for one week, then take 3 tablets twice a day, Disp: 180 capsule, Rfl: 1  History   Social History  . Marital Status: Married    Spouse Name: N/A    Number of Children: N/A  . Years of Education: N/A   Occupational History  . Not on file.   Social History Main Topics  . Smoking status: Never Smoker   . Smokeless tobacco: Current User    Types: Chew  . Alcohol Use: No  . Drug Use: No  . Sexual Activity: Yes    Partners: Female     Comment: married   Other Topics Concern  . Not on file   Social History Narrative  . No narrative on file   Review of Systems  Constitutional: Positive for fever (max of 100.7), chills and diaphoresis.  HENT: Positive for congestion and  sore throat. Negative for ear pain.   Respiratory: Positive for cough.   Gastrointestinal: Negative for nausea, vomiting and diarrhea.  Musculoskeletal: Positive for myalgias.  Neurological: Positive for headaches.   Objective:   Physical Exam  Constitutional: He appears well-developed and well-nourished.  HENT:  Head: Normocephalic and atraumatic.  Right Ear: External ear normal.  Left Ear: External ear normal.  Nose: Nose normal.  Mildly erythematous.  Eyes: Conjunctivae and EOM are normal. Pupils are equal, round, and reactive to light.  Neck: Neck supple.  Anterior cervical adenopathy.  Cardiovascular: Regular rhythm and normal heart sounds.  Tachycardia present.   No murmur heard. Tachycardic at 120.  Pulmonary/Chest: Effort normal and breath sounds normal. He has no rales.  Neurological: He is alert.  Skin: Skin is warm and dry. No rash noted.  Psychiatric: He has a normal mood and affect. His behavior is normal. Judgment and thought content normal.   Results for orders placed in visit on 07/19/13  POCT CBC      Result Value Range   WBC 7.4  4.6 - 10.2 K/uL   Lymph, poc 0.8  0.6 - 3.4   POC LYMPH PERCENT 10.2  10 - 50 %L   MID (cbc) 0.7  0 - 0.9   POC MID % 9.8  0 - 12 %M   POC Granulocyte 5.9  2 - 6.9   Granulocyte percent 80.0  37 - 80 %G   RBC 5.22  4.69 - 6.13 M/uL   Hemoglobin 14.7  14.1 - 18.1 g/dL   HCT, POC 16.1  09.6 - 53.7 %   MCV 90.3  80 - 97 fL   MCH, POC 28.2  27 - 31.2 pg   MCHC 31.2 (*) 31.8 - 35.4 g/dL   RDW, POC 04.5     Platelet Count, POC 240  142 - 424 K/uL   MPV 7.3  0 - 99.8 fL  POCT RAPID STREP A (OFFICE)      Result Value Range   Rapid Strep A Screen Negative  Negative  POCT INFLUENZA A/B      Result Value Range   Influenza A, POC Negative     Influenza B, POC Negative    GLUCOSE, POCT (MANUAL RESULT ENTRY)      Result Value Range   POC Glucose 119 (*) 70 - 99 mg/dl   Assessment & Plan:  Flu-like symptoms - Plan: POCT CBC, POCT  rapid strep A, POCT Influenza A/B, Throat culture ([facility])  Screening for diabetes mellitus - Plan: POCT glucose (manual entry)  Influenza  Opiate addiction  1.  Influenza:  New.  Treat empirically with Tamiflu due to pregnant wife.  Rx for Tamiflu provided; also rx for Atrovent nasal spray and Hycodan cough syrup.  RTC for acute  2.  Screening for DMII: sugar stable despite acute illness; obtained due to tachycardia. 3.  Opiate addiction:  Stable; after review of chart after discharge from office, noted in chart.  Will not provide refill of Hycodan if calls for refill.  Patient requested "a really strong cough syrup to help me sleep".  Meds ordered this encounter  Medications  . oseltamivir (TAMIFLU) 75 MG capsule    Sig: Take 1 capsule (75 mg total) by mouth 2 (two) times daily.    Dispense:  10 capsule    Refill:  0  . ipratropium (ATROVENT) 0.03 % nasal spray    Sig: Place 2 sprays into the nose 2 (two) times daily.    Dispense:  30 mL    Refill:  0  . HYDROcodone-homatropine (HYCODAN) 5-1.5 MG/5ML syrup    Sig: Take 5 mLs by mouth every 6 (six) hours as needed for cough.    Dispense:  180 mL    Refill:  0   I personally performed the services described in this documentation, which was scribed in my presence.  The recorded information has been reviewed and is accurate.  Nilda Simmer, M.D.  [facility] [address] [place], [place]  [phone] phone [phone] fax

## 2013-07-19 NOTE — Patient Instructions (Signed)

## 2013-07-21 LAB — CULTURE, GROUP A STREP: ORGANISM ID, BACTERIA: NORMAL

## 2013-08-01 ENCOUNTER — Other Ambulatory Visit: Payer: Self-pay | Admitting: Neurology

## 2013-08-25 IMAGING — RF DG SHOULDER 2+V*R*
1 series · 3 of 3 positions shown · non-contrast
Comparison: CT scan right shoulder 06/03/2011.

CLINICAL DATA: Removal of hardware in addition of cadaveric to the
humeral head.

RIGHT SHOULDER - 2+ VIEW

[Series 1: run · 3 of 3 slices shown]
[im 1/3]
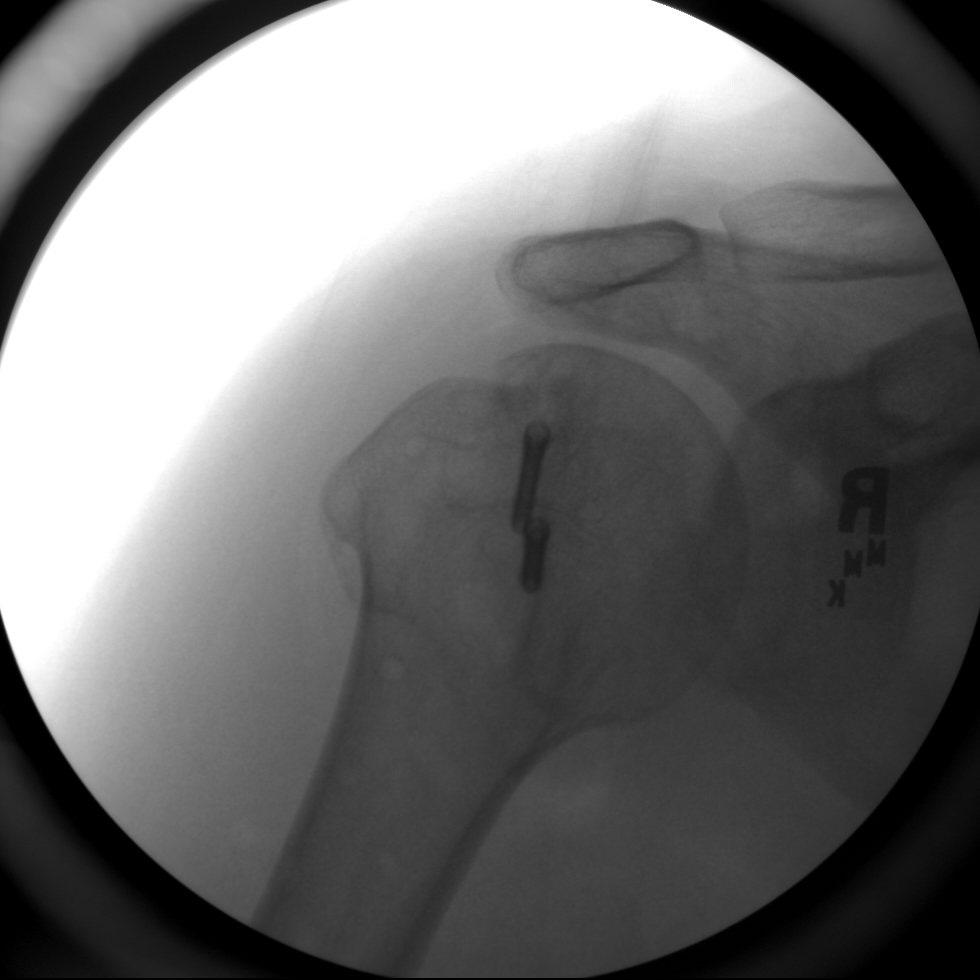
[im 2/3]
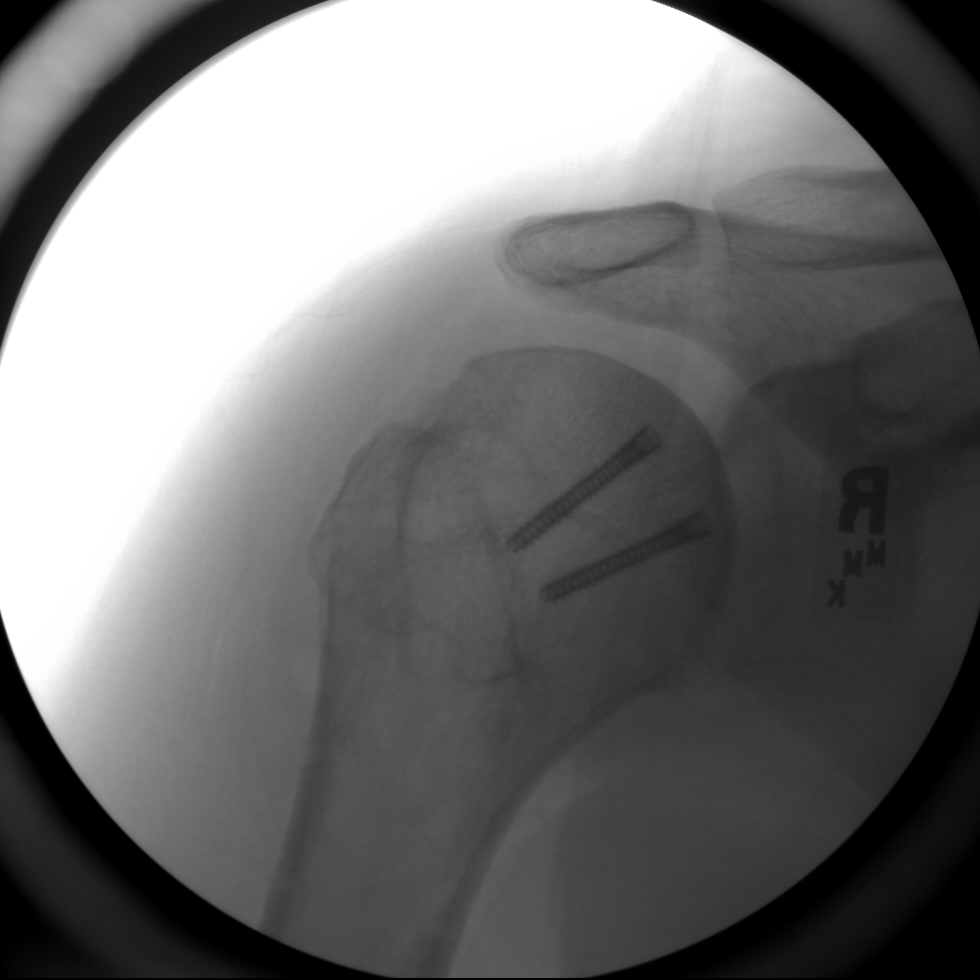
[im 3/3]
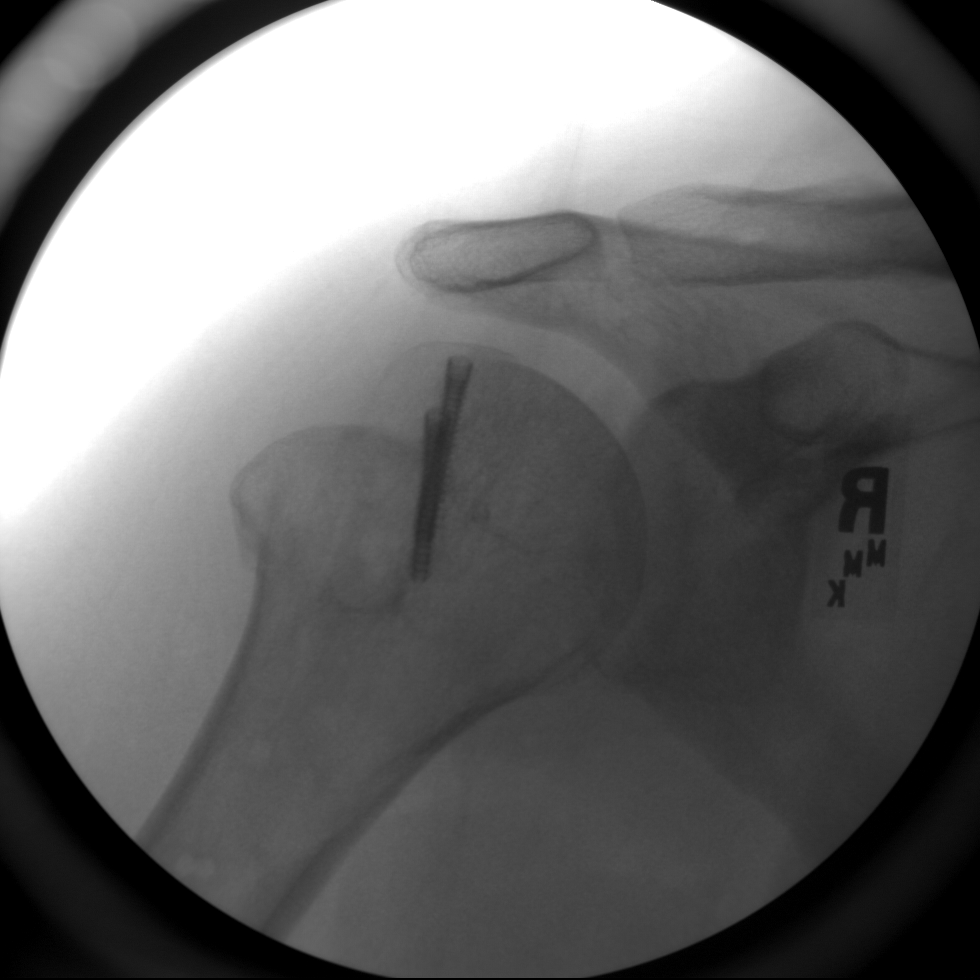

[3 of 3 positions shown; findings below may reference images not displayed]

FINDINGS: Anterior plate and screws seen on comparison study have
been removed.  Two new screws are in place. Cadaveric bone graft at
the greater tuberosity is noted.
IMPRESSION: Postoperative change as above.

## 2013-09-17 IMAGING — CR DG SHOULDER 2+V*R*
3 series · 3 of 3 positions shown · non-contrast
Comparison: Radiographs 07/22/2011.  CT 06/03/2011.

CLINICAL DATA: Seizure.  Right shoulder injury with pain.

RIGHT SHOULDER - 2+ VIEW

[x shoulder ap right (1 of 3)]
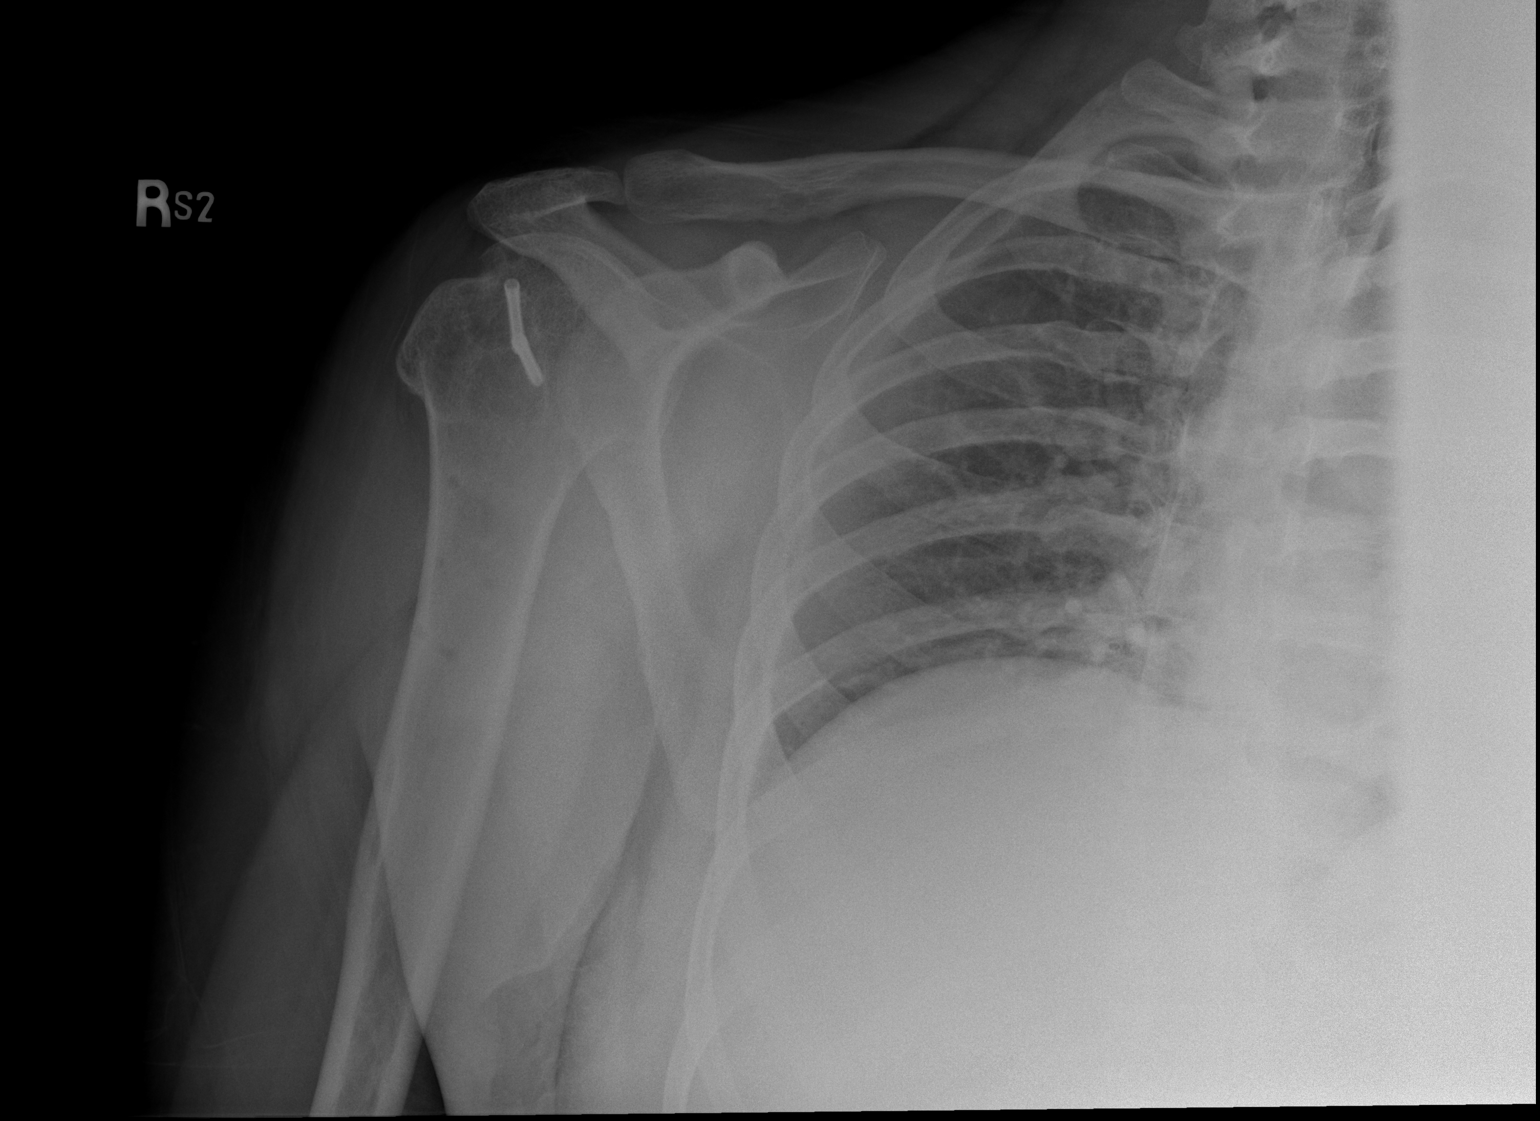

[x shoulder ap right (2 of 3)]
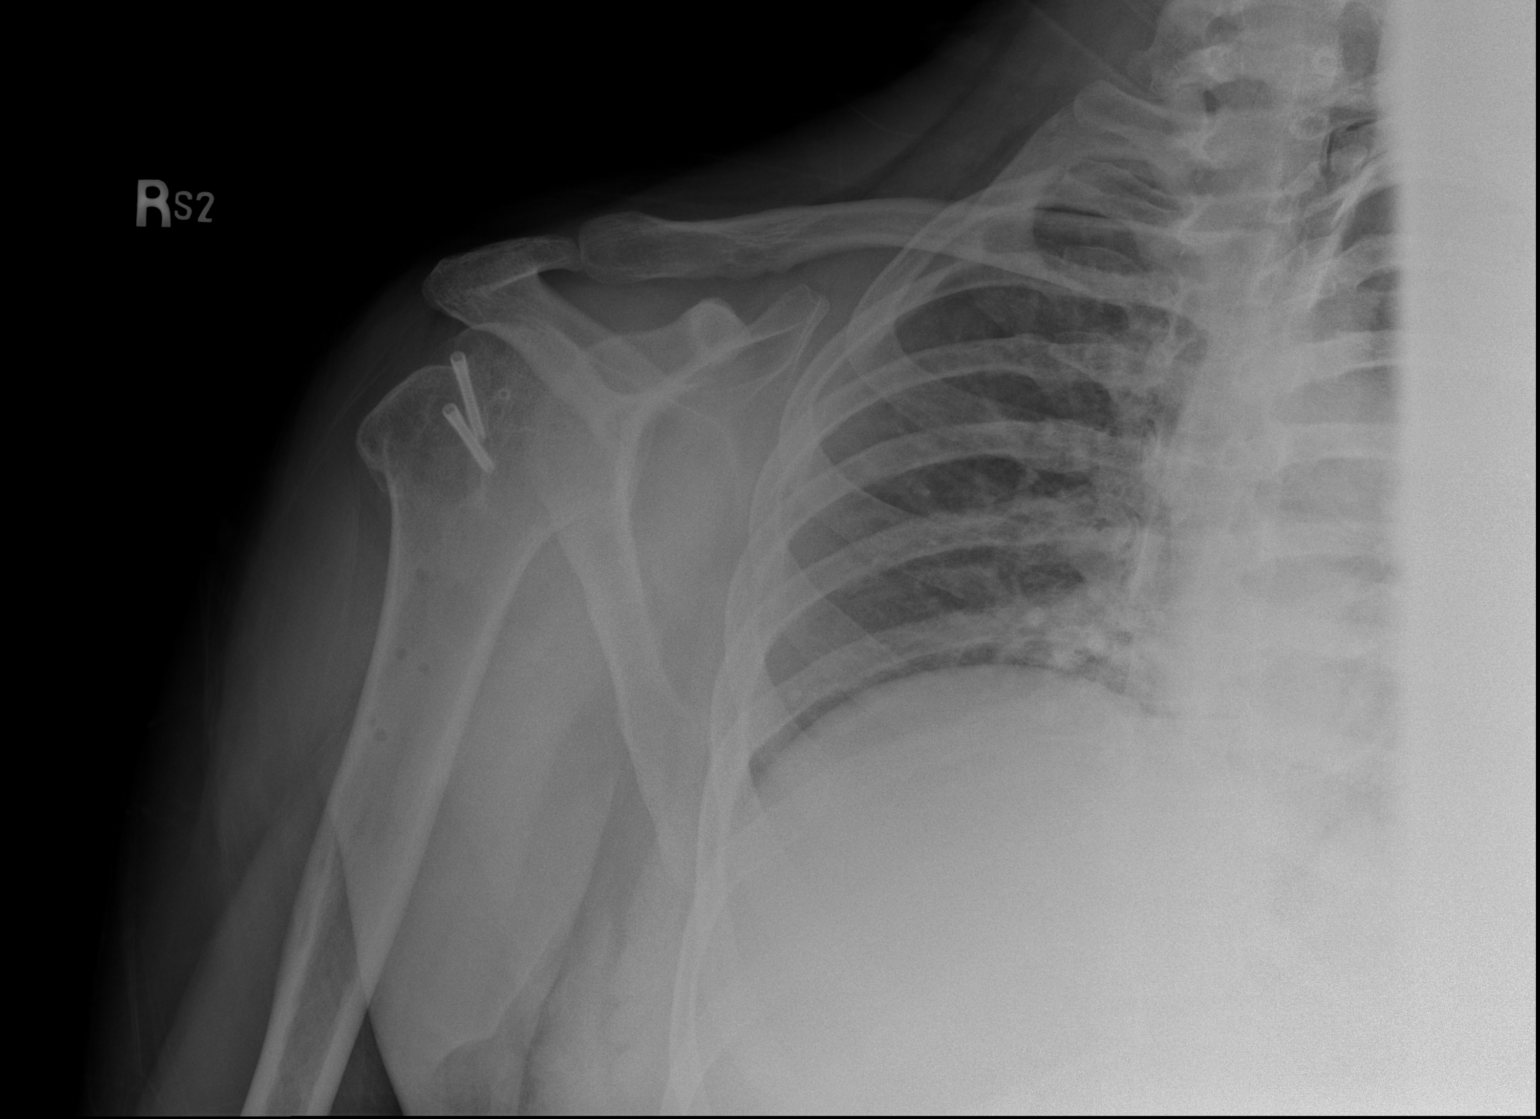

[x shoulder ap right (3 of 3)]
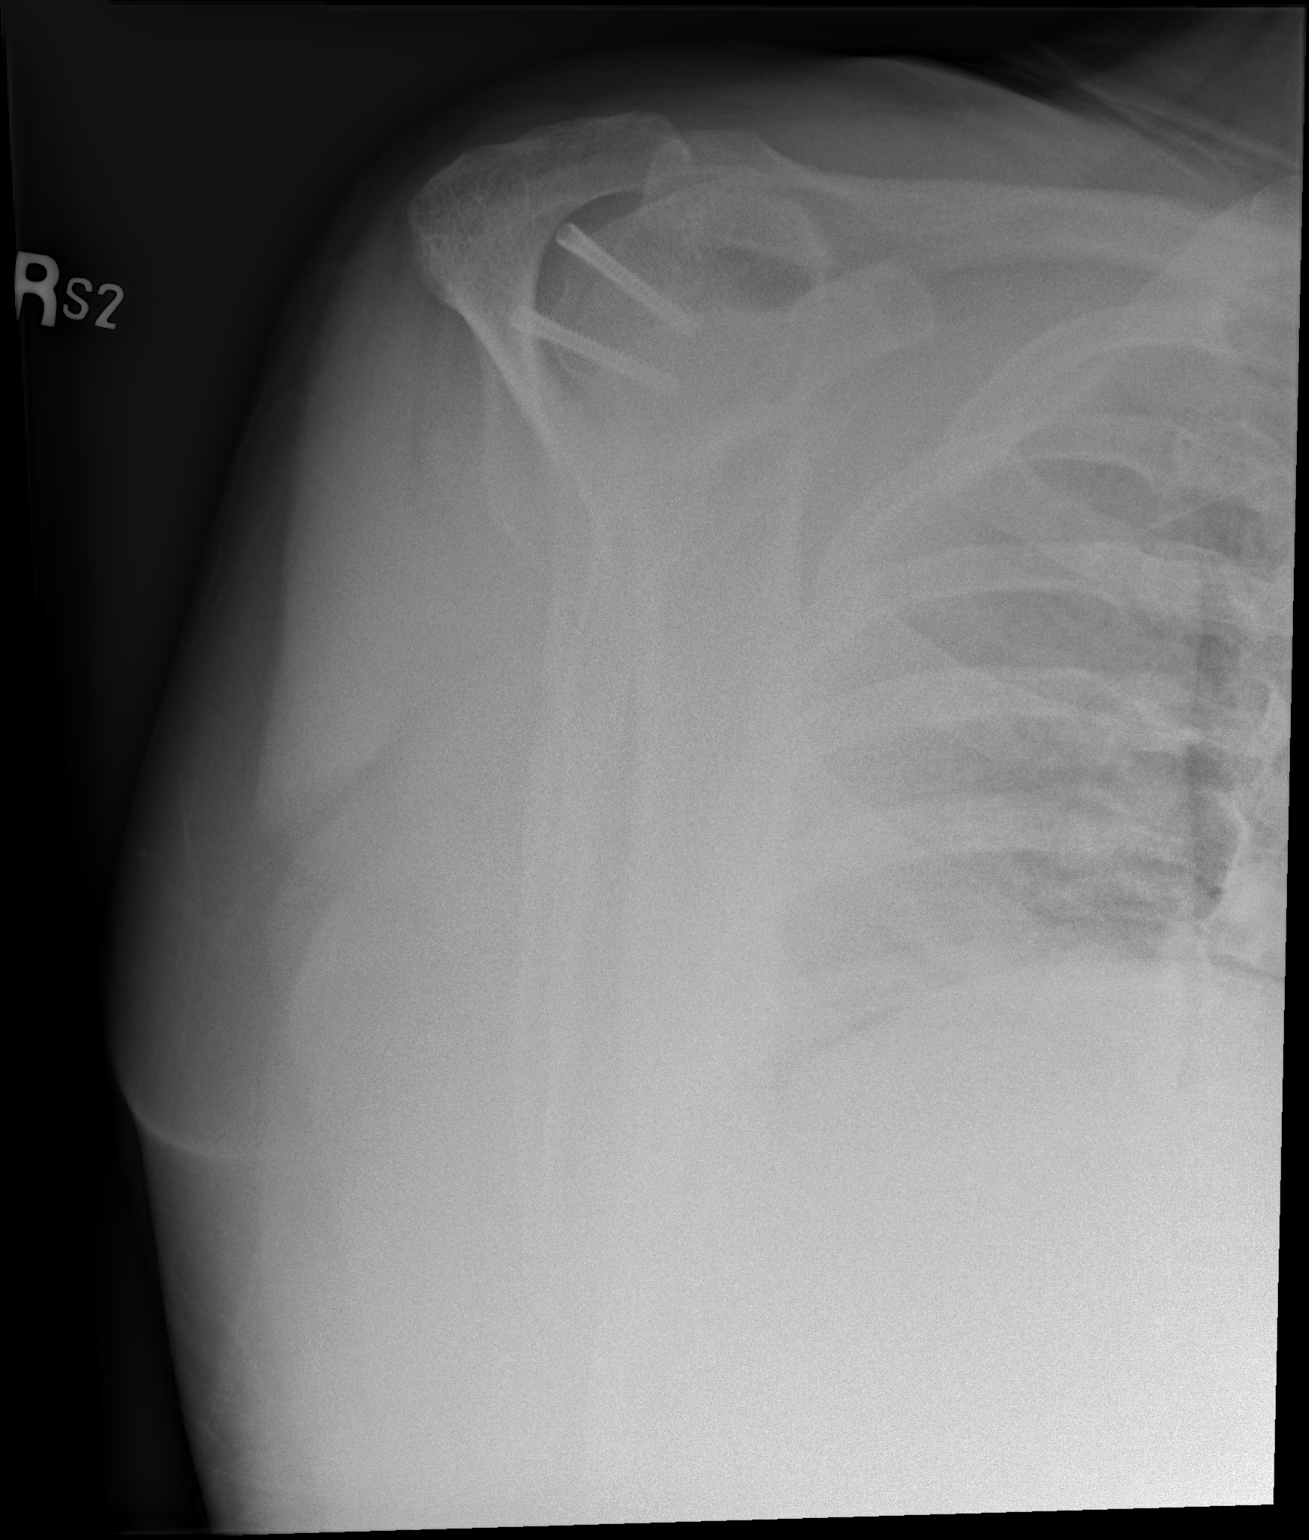

[3 of 3 positions shown; findings below may reference images not displayed]

FINDINGS: There is chronic deformity of the humeral head
posterolaterally related to chronic Hill-Sachs deformity and
subsequent allografting. Appearance is unchanged.  Two cortical
screws within the humeral head are stable in position.  Because of
apparent angulation on the Y-view, the humeral head projects over
the distal clavicle.  There is no evidence of recurrent anterior or
posterior glenohumeral dislocation.
IMPRESSION: Postsurgical changes with chronic deformity of the humeral head.
No recurrent dislocation identified.

## 2013-10-07 ENCOUNTER — Ambulatory Visit: Payer: BC Managed Care – PPO | Admitting: Nurse Practitioner

## 2013-10-13 ENCOUNTER — Telehealth: Payer: Self-pay | Admitting: Nurse Practitioner

## 2013-10-13 ENCOUNTER — Ambulatory Visit: Payer: BC Managed Care – PPO | Admitting: Nurse Practitioner

## 2013-10-13 NOTE — Telephone Encounter (Signed)
No show for scheduled appt 

## 2014-04-27 NOTE — Telephone Encounter (Signed)
error 

## 2014-07-08 ENCOUNTER — Ambulatory Visit (INDEPENDENT_AMBULATORY_CARE_PROVIDER_SITE_OTHER): Payer: BLUE CROSS/BLUE SHIELD | Admitting: Physician Assistant

## 2014-07-08 VITALS — BP 152/80 | HR 104 | Temp 98.2°F | Resp 18 | Ht 73.0 in | Wt 281.2 lb

## 2014-07-08 DIAGNOSIS — R030 Elevated blood-pressure reading, without diagnosis of hypertension: Secondary | ICD-10-CM

## 2014-07-08 DIAGNOSIS — F329 Major depressive disorder, single episode, unspecified: Secondary | ICD-10-CM

## 2014-07-08 DIAGNOSIS — F32A Depression, unspecified: Secondary | ICD-10-CM

## 2014-07-08 DIAGNOSIS — IMO0001 Reserved for inherently not codable concepts without codable children: Secondary | ICD-10-CM

## 2014-07-08 MED ORDER — SERTRALINE HCL 50 MG PO TABS: 50.0000 mg | ORAL_TABLET | Freq: Every day | ORAL | Status: DC

## 2014-07-08 NOTE — Patient Instructions (Signed)
We will see you in 4-6 weeks to follow up on these depressive symptoms.   You do need a physical exam.  Please attempt to schedule this with your recheck, so that your health (kidney, liver, cholesterol, etc.) can be well managed.

## 2014-07-08 NOTE — Progress Notes (Signed)
MRN: [id number] DOB: [birth year]  Subjective:   Chief Complaint  Patient presents with  . Medication Refill    refill on anti-depressant    Derek Mckinney is a 34 y.o. male with PMH of depression, anxiety, seizures, and polysubstance abuse, presenting requesting a refill on his zoloft.  He states that he quit using his mediciaton for about 40 days.  He was using 1/2 tablets a few days per week, by borrowing from wife, but needs his own.  He notes fatigue, agitation, increased sleeping, loss of interest, and increased appetite.  Friends and family, including wife, have stated to patient that he appears agitated and "lazy".  Patient states that he has stopped going to multiple providers, because he has been dealing with an addiction of prescription drugs such as opiates.  He states that he has been clean for 6 months.  He has counsel once per month with psychologist, Derek Mckinney, who is an addictive therapist.  He denies all suicidal or homicidal thoughts, plans, or ideations.  He is also not taking the antiepileptic medication, siting that he has not had seizures for years.   He lives with wife and 2 children, ages 4 years and 10 months.  He works full time.  Derek Mckinney has a current medication list which includes the following prescription(s): acetaminophen, eszopiclone, hydrocodone-homatropine, ipratropium, levetiracetam, lorazepam, oseltamivir, oxycodone-acetaminophen, sertraline, trazodone, and zonisamide.  He is allergic to penicillins.  Derek Mckinney  has a past medical history of Seizures; Depression; Anxiety; Obesity; Headache(784.0); Chronic insomnia; Gout; and Polysubstance abuse. Also  has past surgical history that includes right shoulder surgery; widom teeth extraction; Bankart repair (07/22/2011); Hardware Removal (07/22/2011); and Fracture surgery.  History   Social History  . Marital Status: Married    Spouse Name: N/A    Number of Children: N/A  . Years of Education: N/A    Occupational History  . Not on file.   Social History Main Topics  . Smoking status: Never Smoker   . Smokeless tobacco: Current User    Types: Chew  . Alcohol Use: No  . Drug Use: No  . Sexual Activity:    Partners: Female     Comment: married   Other Topics Concern  . Not on file   Social History Narrative    ROS As in subjective.  Objective:   Vitals: BP 152/80 mmHg  Pulse 104  Temp(Src) 98.2 F (36.8 C) (Oral)  Resp 18  Ht  (1.854 m)  Wt 281 lb 3.2 oz (127.551 kg)  BMI 37.11 kg/m2  SpO2 98%  Physical Exam  Constitutional: He is oriented to person, place, and time and well-developed, well-nourished, and in no distress. No distress.  HENT:  Head: Normocephalic and atraumatic.  Eyes: Pupils are equal, round, and reactive to light. Right eye exhibits no discharge. Left eye exhibits no discharge.  Neck: Normal range of motion. Neck supple. No thyromegaly present.  Cardiovascular: Regular rhythm and normal heart sounds.  Exam reveals no gallop and no friction rub.   No murmur heard. Pulmonary/Chest: Effort normal and breath sounds normal. No respiratory distress. He has no wheezes.  Neurological: He is alert and oriented to person, place, and time. He has normal strength. He is not agitated. He displays tremor. He displays no weakness. He has a normal Romberg Test. Coordination and gait normal.  Skin: Skin is warm and dry.  Psychiatric: Mood, memory, affect and judgment normal.   Rechecked HR and BP: 96, 150/82   Assessment  and Plan :  34 year old male with PMH listed above is here today for medication refill of zoloft.   Depression - Plan: sertraline (ZOLOFT) 50 MG tablet -Pt will continue counsel with addictive therapist -RTC in 4-6 weeks for mental health update  Elevated BP -Patient states that he is incredibly anxious to be at the physician.  We will recheck his BP at next visit in 4-6 weeks, and address anti-hypertensives at that time.  He  states that he will include exercise and healthier eating.    Derek Platt, PA-C [facility] 1/3/20167:44 PM

## 2014-07-09 ENCOUNTER — Encounter: Payer: Self-pay | Admitting: Physician Assistant

## 2014-09-01 ENCOUNTER — Other Ambulatory Visit: Payer: Self-pay

## 2014-09-11 ENCOUNTER — Other Ambulatory Visit: Payer: Self-pay

## 2014-09-11 DIAGNOSIS — F32A Depression, unspecified: Secondary | ICD-10-CM

## 2014-09-11 DIAGNOSIS — F329 Major depressive disorder, single episode, unspecified: Secondary | ICD-10-CM

## 2014-09-11 MED ORDER — SERTRALINE HCL 50 MG PO TABS: 50.0000 mg | ORAL_TABLET | Freq: Every day | ORAL | Status: DC

## 2014-10-25 ENCOUNTER — Other Ambulatory Visit: Payer: Self-pay

## 2014-10-25 DIAGNOSIS — F329 Major depressive disorder, single episode, unspecified: Secondary | ICD-10-CM

## 2014-10-25 DIAGNOSIS — F32A Depression, unspecified: Secondary | ICD-10-CM

## 2014-10-25 MED ORDER — SERTRALINE HCL 50 MG PO TABS: 50.0000 mg | ORAL_TABLET | Freq: Every day | ORAL | Status: DC

## 2014-11-06 ENCOUNTER — Other Ambulatory Visit: Payer: Self-pay | Admitting: *Deleted

## 2014-11-06 ENCOUNTER — Telehealth: Payer: Self-pay | Admitting: *Deleted

## 2014-11-06 DIAGNOSIS — F32A Depression, unspecified: Secondary | ICD-10-CM

## 2014-11-06 DIAGNOSIS — F329 Major depressive disorder, single episode, unspecified: Secondary | ICD-10-CM

## 2014-11-06 MED ORDER — SERTRALINE HCL 50 MG PO TABS: 50.0000 mg | ORAL_TABLET | Freq: Every day | ORAL | Status: DC

## 2014-11-06 NOTE — Telephone Encounter (Signed)
Left message that 15 tabs were called in, but he will not be able to get any more refills until he RTC for a follow up.

## 2015-08-17 ENCOUNTER — Ambulatory Visit (INDEPENDENT_AMBULATORY_CARE_PROVIDER_SITE_OTHER): Payer: BLUE CROSS/BLUE SHIELD | Admitting: Family Medicine

## 2015-08-17 VITALS — BP 130/82 | HR 87 | Temp 98.8°F | Resp 18 | Ht 72.75 in | Wt 261.2 lb

## 2015-08-17 DIAGNOSIS — F329 Major depressive disorder, single episode, unspecified: Secondary | ICD-10-CM

## 2015-08-17 DIAGNOSIS — F32A Depression, unspecified: Secondary | ICD-10-CM

## 2015-08-17 MED ORDER — BUPROPION HCL ER (XL) 150 MG PO TB24: 150.0000 mg | ORAL_TABLET | Freq: Every day | ORAL | Status: DC

## 2015-08-17 NOTE — Progress Notes (Signed)
 @  By signing my name below, I, Raven Small, attest that this documentation has been prepared under the direction and in the presence of Elvina Sidle, MD.  Electronically Signed: Andrew Au, ED Scribe. 08/17/2015. 10:13 AM.  Patient ID: Derek Mckinney MRN: [id number], DOB: Nov 21, 1980, 35 y.o. Date of Encounter: 08/17/2015, 10:10 AM  Primary Physician: Janace Hoard, MD  Chief Complaint:  Chief Complaint  Patient presents with  . Medication Refill    meds for depression   HPI: 35 y.o. year old male with history below presents for a medication refill of anti depressant. He reports hx of polysubstance abuse, mainly prescription medication. He has been to rehab in the past including the [facility]. He and his wife are currently going to marriage counseling at [facility] and states he was advised to restart anti depressant. He has been on and off antidepressants for the past 8 years. He has been on celexa and prozac in the past. He is smokes smokeless tobacco. He works a Biomedical scientist.    Past Medical History  Diagnosis Date  . Seizures (HCC)   . Depression   . Anxiety   . Obesity   . Headache(784.0)   . Chronic insomnia   . Gout   . Polysubstance abuse      Home Meds: Prior to Admission medications   Medication Sig Start Date End Date Taking? Authorizing Provider  acetaminophen (TYLENOL) 500 MG tablet Take 2,000 mg by mouth every 6 (six) hours as needed for pain. pain   Yes Historical Provider, MD  Eszopiclone (ESZOPICLONE) 3 MG TABS Take 3 mg by mouth at bedtime. Reported on 08/17/2015    Historical Provider, MD  LORazepam (ATIVAN) 1 MG tablet Take 1 tablet (1 mg total) by mouth 2 (two) times daily as needed for anxiety (Must last 28 days). Patient not taking: Reported on 07/08/2014 04/11/13   York Spaniel, MD  sertraline (ZOLOFT) 50 MG tablet Take 1 tablet (50 mg total) by mouth daily. NO MORE REFILLS WITHOUT OFFICE VISIT - 2ND NOTICE Patient not taking: Reported  on 08/17/2015 11/06/14   Morrell Riddle, PA-C  traZODone (DESYREL) 100 MG tablet TAKE TWO TABLETS BY MOUTH EVERY NIGHT Patient not taking: Reported on 07/08/2014 06/03/13   York Spaniel, MD  zonisamide (ZONEGRAN) 25 MG capsule One tablet twice a day for one week, then take two tablets twice a day for one week, then take 3 tablets twice a day Patient not taking: Reported on 07/08/2014 04/08/13   York Spaniel, MD    Allergies:  Allergies  Allergen Reactions  . Penicillins Anaphylaxis    Social History   Social History  . Marital Status: Married    Spouse Name: N/A  . Number of Children: N/A  . Years of Education: N/A   Occupational History  . Not on file.   Social History Main Topics  . Smoking status: Never Smoker   . Smokeless tobacco: Current User    Types: Chew  . Alcohol Use: No  . Drug Use: No  . Sexual Activity:    Partners: Female     Comment: married   Other Topics Concern  . Not on file   Social History Narrative     Review of Systems: Constitutional: negative for chills, fever, night sweats, weight changes, or fatigue  HEENT: negative for vision changes, hearing loss, congestion, rhinorrhea, ST, epistaxis, or sinus pressure Cardiovascular: negative for chest pain or palpitations Respiratory: negative for hemoptysis, wheezing, shortness of  breath, or cough Abdominal: negative for abdominal pain, nausea, vomiting, diarrhea, or constipation Dermatological: negative for rash Neurologic: negative for headache, dizziness, or syncope All other systems reviewed and are otherwise negative with the exception to those above and in the HPI.   Physical Exam: Blood pressure 130/82, pulse 87, temperature 98.8 F (37.1 C), temperature source Oral, resp. rate 18, height 6' 0.75" (1.848 m), weight 261 lb 3.2 oz (118.48 kg), SpO2 99 %., Body mass index is 34.69 kg/(m^2). General: Well developed, well nourished, in no acute distress. Head: Normocephalic, atraumatic, eyes  without discharge, sclera non-icteric, nares are without discharge. Bilateral auditory canals clear, TM's are without perforation, pearly grey and translucent with reflective cone of light bilaterally. Oral cavity moist, posterior pharynx without exudate, erythema, peritonsillar abscess, or post nasal drip.  Neck: Supple. No thyromegaly. Full ROM. No lymphadenopathy. Lungs: Clear bilaterally to auscultation without wheezes, rales, or rhonchi. Breathing is unlabored. Heart: RRR with S1 S2. No murmurs, rubs, or gallops appreciated. Extremities/Skin: Warm and dry. No clubbing or cyanosis. No edema. No rashes or suspicious lesions. Neuro: Alert and oriented X 3. Moves all extremities spontaneously. Gait is normal. CNII-XII grossly in tact. Psych:  Responds to questions appropriately with a normal affect.    ASSESSMENT AND PLAN:  35 y.o. year old male with depression This chart was scribed in my presence and reviewed by me personally.    ICD-9-CM ICD-10-CM   1. Depression 311 F32.9 buPROPion (WELLBUTRIN XL) 150 MG 24 hr tablet     Signed, Elvina Sidle, MD 08/17/2015 10:10 AM

## 2015-08-17 NOTE — Patient Instructions (Signed)
You have a follow-up in one month. I'm recommending you see Trena Platt.

## 2015-09-24 ENCOUNTER — Other Ambulatory Visit: Payer: Self-pay

## 2015-09-24 DIAGNOSIS — F32A Depression, unspecified: Secondary | ICD-10-CM

## 2015-09-24 DIAGNOSIS — F329 Major depressive disorder, single episode, unspecified: Secondary | ICD-10-CM

## 2015-09-24 MED ORDER — BUPROPION HCL ER (XL) 150 MG PO TB24: 150.0000 mg | ORAL_TABLET | Freq: Every day | ORAL | Status: DC

## 2015-11-09 ENCOUNTER — Other Ambulatory Visit: Payer: Self-pay

## 2015-11-09 DIAGNOSIS — F32A Depression, unspecified: Secondary | ICD-10-CM

## 2015-11-09 DIAGNOSIS — F329 Major depressive disorder, single episode, unspecified: Secondary | ICD-10-CM

## 2015-11-09 MED ORDER — BUPROPION HCL ER (XL) 150 MG PO TB24: 150.0000 mg | ORAL_TABLET | Freq: Every day | ORAL | Status: DC

## 2016-05-14 DIAGNOSIS — B9789 Other viral agents as the cause of diseases classified elsewhere: Secondary | ICD-10-CM | POA: Diagnosis not present

## 2016-05-14 DIAGNOSIS — J069 Acute upper respiratory infection, unspecified: Secondary | ICD-10-CM | POA: Diagnosis not present

## 2016-05-16 DIAGNOSIS — F4320 Adjustment disorder, unspecified: Secondary | ICD-10-CM | POA: Diagnosis not present

## 2016-05-19 DIAGNOSIS — F4320 Adjustment disorder, unspecified: Secondary | ICD-10-CM | POA: Diagnosis not present

## 2016-07-10 DIAGNOSIS — F4321 Adjustment disorder with depressed mood: Secondary | ICD-10-CM | POA: Diagnosis not present

## 2016-07-31 DIAGNOSIS — F4321 Adjustment disorder with depressed mood: Secondary | ICD-10-CM | POA: Diagnosis not present

## 2016-08-11 DIAGNOSIS — F4321 Adjustment disorder with depressed mood: Secondary | ICD-10-CM | POA: Diagnosis not present

## 2016-12-18 DIAGNOSIS — F331 Major depressive disorder, recurrent, moderate: Secondary | ICD-10-CM | POA: Diagnosis not present

## 2017-01-01 DIAGNOSIS — F331 Major depressive disorder, recurrent, moderate: Secondary | ICD-10-CM | POA: Diagnosis not present

## 2017-01-20 DIAGNOSIS — F331 Major depressive disorder, recurrent, moderate: Secondary | ICD-10-CM | POA: Diagnosis not present

## 2017-02-10 DIAGNOSIS — F331 Major depressive disorder, recurrent, moderate: Secondary | ICD-10-CM | POA: Diagnosis not present

## 2017-03-28 ENCOUNTER — Encounter: Payer: Self-pay | Admitting: Physician Assistant

## 2017-03-28 ENCOUNTER — Ambulatory Visit (INDEPENDENT_AMBULATORY_CARE_PROVIDER_SITE_OTHER): Payer: BLUE CROSS/BLUE SHIELD | Admitting: Physician Assistant

## 2017-03-28 VITALS — BP 143/93 | HR 93 | Temp 97.3°F | Resp 16 | Ht 74.0 in | Wt 258.8 lb

## 2017-03-28 DIAGNOSIS — Z9119 Patient's noncompliance with other medical treatment and regimen: Secondary | ICD-10-CM

## 2017-03-28 DIAGNOSIS — Z91199 Patient's noncompliance with other medical treatment and regimen due to unspecified reason: Secondary | ICD-10-CM

## 2017-03-28 DIAGNOSIS — F329 Major depressive disorder, single episode, unspecified: Secondary | ICD-10-CM

## 2017-03-28 DIAGNOSIS — F32A Depression, unspecified: Secondary | ICD-10-CM

## 2017-03-28 MED ORDER — ESCITALOPRAM OXALATE 10 MG PO TABS: 10.0000 mg | ORAL_TABLET | Freq: Every day | ORAL | 0 refills | Status: DC

## 2017-03-28 NOTE — Progress Notes (Signed)
Derek Mckinney  MRN: [id number] DOB: 10/06/1980  Subjective:  Derek Mckinney is a 36 y.o. male with PMH of depression, anxiety, seizures, and polysubstance abuse (mostly to pharmaceuticals), presenting for evaluation of depression. He has hx of depression. Last visit was in 08/17/15. Was placed on Wellbutrin and had Rx for about half a year. Did not return for follow up. Has not been taking medication for about a year.  He is a recovering addict and notes coming to doctor's office is hard for him. In terms of depression, has been dealing with it since middle school. Dx with clinical depression in 2008. Has been on and off medications. Has tried wellbutrin, pristiq, prozac, lamictal, and zoloft in the past. He feels as if they have all helped and that no one was better than the other. His family can notice a huge difference when he is on medication. He is currently working a job in [place] from [employer] and staying there. It is hard being away from his family.However, when he comes home he is so depressed, his wife does not even want him to come home. He is sporadically seeing a Veterinary surgeon in [place]. He wants a referral to psych in town as well. Has an appointment with her 04/16/17. Has associated anxiety, dysphoric mood, and little interest in doing anything. Denies suicidal or homicidal thoughts.   Review of Systems  Constitutional: Negative for chills, diaphoresis and fever.  Respiratory: Negative for shortness of breath.   Cardiovascular: Negative for chest pain and palpitations.    Patient Active Problem List   Diagnosis Date Noted  . Opiate addiction (HCC) 10/15/2012  . Insomnia 08/05/2012  . Pain in limb 08/05/2012  . Chronic shoulder pain 05/18/2012  . Seizures (HCC) 03/29/2012    Current Outpatient Prescriptions on File Prior to Visit  Medication Sig Dispense Refill  . acetaminophen (TYLENOL) 500 MG tablet Take 2,000 mg by mouth every 6 (six) hours as needed for pain. pain      No current facility-administered medications on file prior to visit.     Allergies  Allergen Reactions  . Penicillins Anaphylaxis     Objective:  BP (!) 143/93   Pulse 93   Temp (!) 97.3 F (36.3 C) (Oral)   Resp 16   Ht  (1.88 m)   Wt 258 lb 12.8 oz (117.4 kg)   SpO2 98%   BMI 33.23 kg/m   Physical Exam  Constitutional: He is oriented to person, place, and time and well-developed, well-nourished, and in no distress.  HENT:  Head: Normocephalic and atraumatic.  Eyes: Conjunctivae are normal.  Neck: Normal range of motion.  Pulmonary/Chest: Effort normal.  Neurological: He is alert and oriented to person, place, and time. Gait normal.  Skin: Skin is warm and dry.  Psychiatric: Affect normal. He exhibits a depressed mood.  Vitals reviewed.    Depression screen [facility] 2/9 03/28/2017 08/17/2015 08/17/2015  Decreased Interest Down, Depressed, Hopeless PHQ - 2 Score Altered sleeping Tired, decreased energy Change in appetite 1 0 0  Feeling bad or failure about yourself  Trouble concentrating 1 1 0  Moving slowly or fidgety/restless 0 0 0  Suicidal thoughts 0 0 0  PHQ-9 Score Difficult doing work/chores Very difficult Very difficult Somewhat difficult    Assessment and Plan :  1. Depression, unspecified  depression type 2. Medically noncompliant Depression is uncontrolled at this time. Pt would benefit from both medication and therapy. Plan to start SSRI today. Educated on potential side effects. Educated to seek care immediately if he develops any suicidal thoughts/ideations. Will also place referral to psych per patient's request. Recommended continuing therapy sessions in [place] as well. Plan to follow up in 4 weeks for reevaluation. Pt has hx of not following up and is aware that is an issue for him. We have discussed the importance of following up and he notes he will schedule his follow up appointment with me  before he leaves today as we can now schedule appointments in advance. Scheduled for follow up on 04/29/17. - Ambulatory referral to Psychiatry - escitalopram (LEXAPRO) 10 MG tablet; Take 1 tablet (10 mg total) by mouth daily.  Dispense: 90 tablet; Refill: 0  A total of 25 minutes was spent in the room with the patient, greater than 50% of which was in counseling/coordination of care regarding depression.  Benjiman Core PA-C  Primary Care at [facility] 03/28/2017 8:30 AM

## 2017-03-28 NOTE — Patient Instructions (Addendum)
For depression, I recommend we start a daily SSRI and weekly/monthly therapy. The SSRI I am prescribing is called lexapro. Please keep in mind that when you start an SSRI it can worsen your depression and anxiety symptoms. It can also increase risk of suicidal thoughts so if this occurs, please seek care immediately. Common side effects of SSRIs include, but are not limited to, GI upset, nausea, vomiting, insomnia, and drowsiness. Typically these side effects will resolve after 2 weeks. Please keep in mind that it can take up to 4-6 weeks for SSRIs to be fully effective.Plan to return to clinic in 4 weeks for reevaluation of symptoms.   In terms of referral to behavioral health, they should contact you in 1-2 weeks.  Thank you for letting me participate in your health and well being.   IF you received an x-ray today, you will receive an invoice from [facility]. Please contact [facility] at [phone] with questions or concerns regarding your invoice.   IF you received labwork today, you will receive an invoice from [facility]. Please contact [facility] at [phone] with questions or concerns regarding your invoice.   Our billing staff will not be able to assist you with questions regarding bills from these companies.  You will be contacted with the lab results as soon as they are available. The fastest way to get your results is to activate your My Chart account. Instructions are located on the last page of this paperwork. If you have not heard from [place] regarding the results in 2 weeks, please contact this office.

## 2017-04-29 ENCOUNTER — Ambulatory Visit: Payer: BLUE CROSS/BLUE SHIELD | Admitting: Physician Assistant

## 2017-05-25 ENCOUNTER — Ambulatory Visit (INDEPENDENT_AMBULATORY_CARE_PROVIDER_SITE_OTHER): Payer: BLUE CROSS/BLUE SHIELD | Admitting: Psychiatry

## 2017-05-25 ENCOUNTER — Encounter (HOSPITAL_COMMUNITY): Payer: Self-pay | Admitting: Psychiatry

## 2017-05-25 DIAGNOSIS — Z813 Family history of other psychoactive substance abuse and dependence: Secondary | ICD-10-CM | POA: Diagnosis not present

## 2017-05-25 DIAGNOSIS — Z818 Family history of other mental and behavioral disorders: Secondary | ICD-10-CM | POA: Diagnosis not present

## 2017-05-25 DIAGNOSIS — F331 Major depressive disorder, recurrent, moderate: Secondary | ICD-10-CM | POA: Insufficient documentation

## 2017-05-25 DIAGNOSIS — Z811 Family history of alcohol abuse and dependence: Secondary | ICD-10-CM | POA: Diagnosis not present

## 2017-05-25 DIAGNOSIS — F419 Anxiety disorder, unspecified: Secondary | ICD-10-CM | POA: Diagnosis not present
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
## 2017-06-15 ENCOUNTER — Ambulatory Visit (HOSPITAL_COMMUNITY): Payer: Self-pay | Admitting: Psychiatry

## 2017-06-19 ENCOUNTER — Ambulatory Visit (HOSPITAL_COMMUNITY): Payer: Self-pay | Admitting: Psychiatry

## 2017-06-19 ENCOUNTER — Encounter (HOSPITAL_COMMUNITY): Payer: Self-pay | Admitting: Psychiatry

## 2017-06-19 ENCOUNTER — Ambulatory Visit (INDEPENDENT_AMBULATORY_CARE_PROVIDER_SITE_OTHER): Payer: BLUE CROSS/BLUE SHIELD | Admitting: Psychiatry

## 2017-06-19 VITALS — BP 150/80 | HR 94 | Ht 74.0 in | Wt 274.0 lb

## 2017-06-19 DIAGNOSIS — F332 Major depressive disorder, recurrent severe without psychotic features: Secondary | ICD-10-CM | POA: Diagnosis not present

## 2017-06-19 DIAGNOSIS — F4312 Post-traumatic stress disorder, chronic: Secondary | ICD-10-CM | POA: Diagnosis not present

## 2017-06-19 DIAGNOSIS — Z818 Family history of other mental and behavioral disorders: Secondary | ICD-10-CM

## 2017-06-19 DIAGNOSIS — Z813 Family history of other psychoactive substance abuse and dependence: Secondary | ICD-10-CM

## 2017-06-19 DIAGNOSIS — Z811 Family history of alcohol abuse and dependence: Secondary | ICD-10-CM | POA: Diagnosis not present

## 2017-06-19 MED ORDER — ESCITALOPRAM OXALATE 20 MG PO TABS: 20.0000 mg | ORAL_TABLET | Freq: Every day | ORAL | 1 refills | Status: DC

## 2017-06-19 MED ORDER — AMITRIPTYLINE HCL 25 MG PO TABS: 25.0000 mg | ORAL_TABLET | Freq: Every day | ORAL | 3 refills | Status: DC
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
## 2017-06-23 ENCOUNTER — Other Ambulatory Visit: Payer: Self-pay | Admitting: Physician Assistant

## 2017-06-23 DIAGNOSIS — F329 Major depressive disorder, single episode, unspecified: Secondary | ICD-10-CM

## 2017-06-23 DIAGNOSIS — F32A Depression, unspecified: Secondary | ICD-10-CM

## 2017-07-13 ENCOUNTER — Telehealth (HOSPITAL_COMMUNITY): Payer: Self-pay

## 2017-07-13 DIAGNOSIS — F332 Major depressive disorder, recurrent severe without psychotic features: Secondary | ICD-10-CM

## 2017-07-13 NOTE — Telephone Encounter (Signed)
Medication refill request - [facility] from [facility]'s [facility] for a 90 day supply of his Amitriptyline, last ordered for 30 day supplies 06/19/17 + 3 refills. Patient returns 09/14/17.

## 2017-07-13 NOTE — Telephone Encounter (Signed)
Sure, sending 90 days would be fine. Thank you! Alex

## 2017-07-14 MED ORDER — AMITRIPTYLINE HCL 25 MG PO TABS: 50.0000 mg | ORAL_TABLET | Freq: Every day | ORAL | 0 refills | Status: DC

## 2017-07-14 NOTE — Telephone Encounter (Signed)
A new 90 day order for patient's Amitriptyline 25 mg, 2 at bedtime, #180 with no refills e-scribed to [facility]'s [facility] as approved by Dr. Rene KocherEksir.

## 2017-07-15 ENCOUNTER — Encounter (HOSPITAL_COMMUNITY): Payer: Self-pay | Admitting: Psychiatry

## 2017-08-14 ENCOUNTER — Encounter (HOSPITAL_COMMUNITY): Payer: Self-pay | Admitting: Psychiatry

## 2017-08-14 ENCOUNTER — Other Ambulatory Visit (HOSPITAL_COMMUNITY): Payer: Self-pay | Admitting: Psychiatry

## 2017-08-14 DIAGNOSIS — F332 Major depressive disorder, recurrent severe without psychotic features: Secondary | ICD-10-CM

## 2017-08-17 ENCOUNTER — Encounter (HOSPITAL_COMMUNITY): Payer: Self-pay | Admitting: Psychiatry

## 2017-08-17 ENCOUNTER — Telehealth (HOSPITAL_COMMUNITY): Payer: Self-pay | Admitting: Psychiatry

## 2017-08-17 ENCOUNTER — Ambulatory Visit (INDEPENDENT_AMBULATORY_CARE_PROVIDER_SITE_OTHER): Payer: BLUE CROSS/BLUE SHIELD | Admitting: Psychiatry

## 2017-08-17 DIAGNOSIS — F332 Major depressive disorder, recurrent severe without psychotic features: Secondary | ICD-10-CM

## 2017-08-17 DIAGNOSIS — Z811 Family history of alcohol abuse and dependence: Secondary | ICD-10-CM

## 2017-08-17 DIAGNOSIS — Z813 Family history of other psychoactive substance abuse and dependence: Secondary | ICD-10-CM | POA: Diagnosis not present

## 2017-08-17 DIAGNOSIS — F4312 Post-traumatic stress disorder, chronic: Secondary | ICD-10-CM

## 2017-08-17 DIAGNOSIS — Z818 Family history of other mental and behavioral disorders: Secondary | ICD-10-CM | POA: Diagnosis not present

## 2017-08-17 DIAGNOSIS — Z79899 Other long term (current) drug therapy: Secondary | ICD-10-CM

## 2017-08-17 DIAGNOSIS — Z630 Problems in relationship with spouse or partner: Secondary | ICD-10-CM

## 2017-08-17 DIAGNOSIS — F17220 Nicotine dependence, chewing tobacco, uncomplicated: Secondary | ICD-10-CM

## 2017-08-17 MED ORDER — AMITRIPTYLINE HCL 75 MG PO TABS: 75.0000 mg | ORAL_TABLET | Freq: Every day | ORAL | 1 refills | Status: DC

## 2017-08-17 MED ORDER — ESCITALOPRAM OXALATE 20 MG PO TABS: 20.0000 mg | ORAL_TABLET | Freq: Every day | ORAL | 1 refills | Status: DC

## 2017-08-17 NOTE — Telephone Encounter (Signed)
We can offer him a 1 or 1:30 pm slot today and leave the other 30 minutes blocked for php
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
## 2017-08-17 NOTE — Telephone Encounter (Signed)
Hey there, he is looking for an update on [employer] auth

## 2017-09-14 ENCOUNTER — Encounter (HOSPITAL_COMMUNITY): Payer: Self-pay | Admitting: Psychiatry

## 2017-09-14 ENCOUNTER — Ambulatory Visit (INDEPENDENT_AMBULATORY_CARE_PROVIDER_SITE_OTHER): Payer: BLUE CROSS/BLUE SHIELD | Admitting: Psychiatry

## 2017-09-14 VITALS — BP 158/102 | HR 115 | Ht 74.0 in | Wt 279.0 lb

## 2017-09-14 DIAGNOSIS — F4312 Post-traumatic stress disorder, chronic: Secondary | ICD-10-CM

## 2017-09-14 DIAGNOSIS — Z79899 Other long term (current) drug therapy: Secondary | ICD-10-CM | POA: Diagnosis not present

## 2017-09-14 DIAGNOSIS — F332 Major depressive disorder, recurrent severe without psychotic features: Secondary | ICD-10-CM | POA: Diagnosis not present

## 2017-09-14 DIAGNOSIS — Z818 Family history of other mental and behavioral disorders: Secondary | ICD-10-CM

## 2017-09-14 DIAGNOSIS — Z630 Problems in relationship with spouse or partner: Secondary | ICD-10-CM | POA: Diagnosis not present

## 2017-09-14 DIAGNOSIS — F17220 Nicotine dependence, chewing tobacco, uncomplicated: Secondary | ICD-10-CM

## 2017-09-14 DIAGNOSIS — Z811 Family history of alcohol abuse and dependence: Secondary | ICD-10-CM | POA: Diagnosis not present

## 2017-09-14 DIAGNOSIS — R4581 Low self-esteem: Secondary | ICD-10-CM

## 2017-09-14 DIAGNOSIS — Z813 Family history of other psychoactive substance abuse and dependence: Secondary | ICD-10-CM | POA: Diagnosis not present

## 2017-09-14 MED ORDER — ESCITALOPRAM OXALATE 20 MG PO TABS: 20.0000 mg | ORAL_TABLET | Freq: Every day | ORAL | 1 refills | Status: DC
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
## 2017-09-25 DIAGNOSIS — F4310 Post-traumatic stress disorder, unspecified: Secondary | ICD-10-CM | POA: Diagnosis not present

## 2017-10-06 ENCOUNTER — Ambulatory Visit (INDEPENDENT_AMBULATORY_CARE_PROVIDER_SITE_OTHER): Payer: BLUE CROSS/BLUE SHIELD | Admitting: Psychology

## 2017-10-06 DIAGNOSIS — F418 Other specified anxiety disorders: Secondary | ICD-10-CM

## 2017-10-09 DIAGNOSIS — F4310 Post-traumatic stress disorder, unspecified: Secondary | ICD-10-CM | POA: Diagnosis not present

## 2017-10-16 ENCOUNTER — Ambulatory Visit (HOSPITAL_COMMUNITY): Payer: Self-pay

## 2017-10-21 ENCOUNTER — Ambulatory Visit: Payer: BLUE CROSS/BLUE SHIELD | Admitting: Psychology

## 2017-10-22 ENCOUNTER — Ambulatory Visit: Payer: BLUE CROSS/BLUE SHIELD | Admitting: Psychology

## 2017-11-04 ENCOUNTER — Ambulatory Visit: Payer: Self-pay | Admitting: Psychology

## 2017-11-06 DIAGNOSIS — F4310 Post-traumatic stress disorder, unspecified: Secondary | ICD-10-CM | POA: Diagnosis not present

## 2017-11-13 ENCOUNTER — Ambulatory Visit (HOSPITAL_COMMUNITY): Payer: Self-pay

## 2017-11-16 ENCOUNTER — Ambulatory Visit (HOSPITAL_COMMUNITY): Payer: BLUE CROSS/BLUE SHIELD | Admitting: Psychiatry

## 2017-11-20 ENCOUNTER — Other Ambulatory Visit (HOSPITAL_COMMUNITY): Payer: BLUE CROSS/BLUE SHIELD | Attending: Psychiatry | Admitting: Emergency Medicine

## 2017-11-20 DIAGNOSIS — F332 Major depressive disorder, recurrent severe without psychotic features: Secondary | ICD-10-CM

## 2017-11-20 NOTE — Progress Notes (Signed)
Pt reported to [facility] for cortical mapping and motor threshold determination for Repetitive Transcranial Magnetic Stimulation treatment for Major Depressive Disorder. Pt completed a PHQ-9 with a score of 22 (severe depression). Pt also completed a Beck's Depression Inventory with a score of 30 (severe depression). Prior to procedure, pt signed an informed consent agreement for TMS treatment. Pt's treatment area was found by applying single pulses to her left motor cortex, hunting along the anterior/posterior plane and along the superior oblique angle until the best motor response was elicited from the pt's right thumb. The best response was observed at 6.0cm A/P and 32 degrees SOA, with a coil angle of 0 degrees. Pt's motor threshold was calculated using the [employer]'s proprietary MT Assist algorithm, which produced a calculated motor threshold of 1.5 SMT. Per these findings, pt's treatment parameters are as follows: A/P -- 11.5 cm, SOA -- 32 degrees, Coil Angle -- 0 degrees, Motor Threshold -- 1.46 SMT. With these parameters, the pt will receive 36 sessions of TMS according to the following protocol: 3000 pulses per session, with stimulation in bursts of pulses lasting 4 seconds at a frequency of 10 Hz, separated by 26 seconds of rest. After determining pt's tx parameters, coil was moved to the treatment location, and the first burst of pulses was applied at a reduced power of 80%MT. Pt reported no complaints, and stated that the stimulation was tolerable. Upon completion of mapping, pt completed a few treatment intervals for observation of side effects. Pt tolerated tx well. Pt departed from clinic without issue.

## 2017-11-23 ENCOUNTER — Other Ambulatory Visit (INDEPENDENT_AMBULATORY_CARE_PROVIDER_SITE_OTHER): Payer: BLUE CROSS/BLUE SHIELD | Admitting: Emergency Medicine

## 2017-11-23 DIAGNOSIS — F332 Major depressive disorder, recurrent severe without psychotic features: Secondary | ICD-10-CM | POA: Diagnosis not present

## 2017-11-23 NOTE — Progress Notes (Signed)
Patient reported to [facility] for Repetitive Transcranial Magnetic Stimulation treatment for severe episode of recurrent major depressive disorder, without psychotic features. Patient presented with appropriate affect, level mood and denied any suicidal or homicidal ideations. Patient denies any other current symptoms and remains optimistic with continued TMS treatment. Patient reported no change in alcohol/substane use, caffeine consumption, sleep pattern or metal implant status since previous treatment. Ptpresented withpleasantaffect to txsession. Heshared that he had a good weekend and did not experience any side effects. He watched TV during tx session.Power levelwas at 100%for the duration of the tx. Power level will be titrated to 120% in the next few days.Patient reported nocomplaints anddiscomfort during tx.Patientdeparted post-treatment with no majorreported concerns.

## 2017-11-24 ENCOUNTER — Other Ambulatory Visit (INDEPENDENT_AMBULATORY_CARE_PROVIDER_SITE_OTHER): Payer: BLUE CROSS/BLUE SHIELD | Admitting: Emergency Medicine

## 2017-11-24 DIAGNOSIS — F332 Major depressive disorder, recurrent severe without psychotic features: Secondary | ICD-10-CM

## 2017-11-24 NOTE — Progress Notes (Signed)
Patient reported to [facility] for Repetitive Transcranial Magnetic Stimulation treatment for severe episode of recurrent major depressive disorder, without psychotic features. Patient presented with appropriate affect, level mood and denied any suicidal or homicidal ideations. Patient denies any other current symptoms and remains optimistic with continued TMS treatment. Patient reported no change in alcohol/substane use, caffeine consumption, sleep pattern or metal implant status since previous treatment. Ptpresented withpleasantaffect to txsession. Heshared that he had a headache yesterday evening. He plans on taking pain relieve medication after tx. He watched TV during tx session.Power level was increased to 110% for the duration of the tx. Power level will be titrated to 120% in the next few days.Patient reported nocomplaints anddiscomfort during tx.Patientdeparted post-treatment with no majorreported concerns.

## 2017-11-25 ENCOUNTER — Other Ambulatory Visit (INDEPENDENT_AMBULATORY_CARE_PROVIDER_SITE_OTHER): Payer: BLUE CROSS/BLUE SHIELD | Admitting: Emergency Medicine

## 2017-11-25 DIAGNOSIS — F332 Major depressive disorder, recurrent severe without psychotic features: Secondary | ICD-10-CM | POA: Diagnosis not present

## 2017-11-25 NOTE — Progress Notes (Signed)
Patient reported to [facility] for Repetitive Transcranial Magnetic Stimulation treatment for severe episode of recurrent major depressive disorder, without psychotic features. Patient presented with appropriate affect, level mood and denied any suicidal or homicidal ideations. Patient denies any other current symptoms and remains optimistic with continued TMS treatment. Patient reported no change in alcohol/substane use, caffeine consumption, sleep pattern or metal implant status since previous treatment. Ptpresented withpleasantaffect to txsession.Heshared that he has purchased a scale to start his new lifestyle change.He spoke to Clinical research associate about the changes he will like to make in the near future. He periodically watched [employer]. Power level was increased to 120%for the duration of the tx.Power level will remain at 120%. Patient reported nocomplaints anddiscomfort during tx.Patientdeparted post-treatment with no majorreported concerns.

## 2017-11-26 ENCOUNTER — Encounter (HOSPITAL_COMMUNITY): Payer: Self-pay

## 2017-11-27 ENCOUNTER — Other Ambulatory Visit (INDEPENDENT_AMBULATORY_CARE_PROVIDER_SITE_OTHER): Payer: BLUE CROSS/BLUE SHIELD | Admitting: Emergency Medicine

## 2017-11-27 DIAGNOSIS — F332 Major depressive disorder, recurrent severe without psychotic features: Secondary | ICD-10-CM

## 2017-11-27 NOTE — Progress Notes (Signed)
Patient reported to [facility] for Repetitive Transcranial Magnetic Stimulation treatment for severe episode of recurrent major depressive disorder, without psychotic features. Patient presented with appropriate affect, level mood and denied any suicidal or homicidal ideations. Patient denies any other current symptoms and remains optimistic with continued TMS treatment. Patient reported no change in alcohol/substane use, caffeine consumption, sleep pattern or metal implant status since previous treatment. Ptpresented withpleasantaffect to txsession.He shared that he has been having long and busy days at work. Hewatched TV during tx session.Power level remained at 120% for the duration of tx sesion.Patient reported nocomplaints anddiscomfort during tx.Patientdeparted post-treatment with no majorreported concerns.

## 2017-12-01 ENCOUNTER — Encounter (HOSPITAL_COMMUNITY): Payer: Self-pay | Admitting: Emergency Medicine

## 2017-12-02 ENCOUNTER — Other Ambulatory Visit (INDEPENDENT_AMBULATORY_CARE_PROVIDER_SITE_OTHER): Payer: BLUE CROSS/BLUE SHIELD | Admitting: Emergency Medicine

## 2017-12-02 DIAGNOSIS — F332 Major depressive disorder, recurrent severe without psychotic features: Secondary | ICD-10-CM

## 2017-12-02 NOTE — Progress Notes (Signed)
Patient reported to [facility] for Repetitive Transcranial Magnetic Stimulation treatment for severe episode of recurrent major depressive disorder, without psychotic features. Patient presented with appropriate affect, level mood and denied any suicidal or homicidal ideations. Patient denies any other current symptoms and remains optimistic with continued TMS treatment. Patient reported no change in alcohol/substane use, caffeine consumption, sleep pattern or metal implant status since previous treatment. Ptpresented withpleasantaffect to txsession. He created three short term goals that he will complete by the end of this week and over the weekend. 1. Attend family social events at least once week  2. Plan special birthday outing for wife's birthday 3. Purchase tickets and attend grasshopper game with family Hewatched TV for the remainder of  tx session.Power level remained at 120% for the duration of tx sesion.Patient reported nocomplaints anddiscomfort during tx.Patientdeparted post-treatment with no majorreported concerns.

## 2017-12-03 ENCOUNTER — Encounter (HOSPITAL_COMMUNITY): Payer: Self-pay | Admitting: Emergency Medicine

## 2017-12-04 ENCOUNTER — Other Ambulatory Visit (INDEPENDENT_AMBULATORY_CARE_PROVIDER_SITE_OTHER): Payer: BLUE CROSS/BLUE SHIELD | Admitting: Emergency Medicine

## 2017-12-04 DIAGNOSIS — F332 Major depressive disorder, recurrent severe without psychotic features: Secondary | ICD-10-CM | POA: Diagnosis not present

## 2017-12-04 NOTE — Progress Notes (Signed)
Patient reported to [facility] for Repetitive Transcranial Magnetic Stimulation treatment for severe episode of recurrent major depressive disorder, without psychotic features. Patient presented with appropriate affect, level mood and denied any suicidal or homicidal ideations. Patient denies any other current symptoms and remains optimistic with continued TMS treatment. Patient reported no change in alcohol/substane use, caffeine consumption, sleep pattern or metal implant status since previous treatment. Ptpresented withpleasantaffect to txsession. He shared that he has completed 2/3 goals on his list. He purchased tickets for his family to attend the grasshoppers game and participated in a family outing. Hewatched TV for the remainder of  tx session.Power levelremained at 120% for the duration of tx sesion.Patient reported nocomplaints anddiscomfort during tx.Patientdeparted post-treatment with no majorreported concerns.

## 2017-12-07 ENCOUNTER — Other Ambulatory Visit (HOSPITAL_COMMUNITY): Payer: BLUE CROSS/BLUE SHIELD | Attending: Psychiatry | Admitting: Emergency Medicine

## 2017-12-07 DIAGNOSIS — F332 Major depressive disorder, recurrent severe without psychotic features: Secondary | ICD-10-CM

## 2017-12-07 NOTE — Progress Notes (Signed)
Patient reported to [facility] for Repetitive Transcranial Magnetic Stimulation treatment for severe episode of recurrent major depressive disorder, without psychotic features. Patient presented with appropriate affect, level mood and denied any suicidal or homicidal ideations. Patient denies any other current symptoms and remains optimistic with continued TMS treatment. Patient reported no change in alcohol/substane use, caffeine consumption, sleep pattern or metal implant status since previous treatment. Ptpresented withpleasantaffect to txsession. He shared they were unable to attend the Grasshoppers game due to the rain/hail Friday afternoon. He plans to take his children out shopping to purchase a gift for their mother's birthday. This is abnormal for him, however, he has found the motivation to complete this errand. Hewatched TV for the remainder of  tx session.Power levelremained at 120% for the duration of tx sesion.Patient reported nocomplaints anddiscomfort during tx.Patientdeparted post-treatment with no majorreported concerns.

## 2017-12-08 ENCOUNTER — Encounter (HOSPITAL_COMMUNITY): Payer: Self-pay

## 2017-12-09 ENCOUNTER — Other Ambulatory Visit (INDEPENDENT_AMBULATORY_CARE_PROVIDER_SITE_OTHER): Payer: BLUE CROSS/BLUE SHIELD | Admitting: Emergency Medicine

## 2017-12-09 DIAGNOSIS — F332 Major depressive disorder, recurrent severe without psychotic features: Secondary | ICD-10-CM | POA: Diagnosis not present

## 2017-12-09 NOTE — Progress Notes (Signed)
Patient reported to [facility] for Repetitive Transcranial Magnetic Stimulation treatment for severe episode of recurrent major depressive disorder, without psychotic features. Patient presented with appropriate affect, level mood and denied any suicidal or homicidal ideations. Patient denies any other current symptoms and remains optimistic with continued TMS treatment. Patient reported no change in alcohol/substane use, caffeine consumption, sleep pattern or metal implant status since previous treatment. Ptpresented withpleasantaffect to txsession.He shared that he had to miss yesterday's appointment due to training workshop. 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 Hewatched TVfor the remainder oftx session.Power levelremained at 120% for the duration of tx sesion.Patient reported nocomplaints anddiscomfort during tx.Patientdeparted post-treatment with no majorreported concerns.

## 2017-12-10 ENCOUNTER — Encounter (HOSPITAL_COMMUNITY): Payer: Self-pay

## 2017-12-10 ENCOUNTER — Other Ambulatory Visit (INDEPENDENT_AMBULATORY_CARE_PROVIDER_SITE_OTHER): Payer: BLUE CROSS/BLUE SHIELD | Admitting: Emergency Medicine

## 2017-12-10 DIAGNOSIS — F332 Major depressive disorder, recurrent severe without psychotic features: Secondary | ICD-10-CM

## 2017-12-10 NOTE — Progress Notes (Signed)
[facility] for Repetitive Transcranial Magnetic Stimulation treatment for severe episode of recurrent major depressive disorder, without psychotic features. Patient presented with appropriate affect, level mood and denied any suicidal or homicidal ideations. Patient denies any other current symptoms and remains optimistic with continued TMS treatment. Patient reported no change in alcohol/substane use, caffeine consumption, sleep pattern or metal implant status since previous treatment. Ptpresented withpleasantaffect to txsession. He shared that he took a personal leave from work today. He has not feeling well and he needed to catch up on sleep. He shared that he is interested in taking golf lessons. Him and his wife incorporated that into their budget. He is excited to have a new hobby to relieve stress. He watched TVfor the remainder oftx session.Power levelremained at 120% for the duration of tx sesion.Patient reported nocomplaints anddiscomfort during tx.Patientdeparted post-treatment with no majorreported concerns.

## 2017-12-11 ENCOUNTER — Other Ambulatory Visit (INDEPENDENT_AMBULATORY_CARE_PROVIDER_SITE_OTHER): Payer: BLUE CROSS/BLUE SHIELD | Admitting: Emergency Medicine

## 2017-12-11 DIAGNOSIS — F332 Major depressive disorder, recurrent severe without psychotic features: Secondary | ICD-10-CM

## 2017-12-11 NOTE — Progress Notes (Signed)
Patient reported to [facility] for Repetitive Transcranial Magnetic Stimulation treatment for severe episode of recurrent major depressive disorder, without psychotic features. Patient presented with appropriate affect, level mood and denied any suicidal or homicidal ideations. Patient denies any other current symptoms and remains optimistic with continued TMS treatment. Patient reported no change in alcohol/substane use, caffeine consumption, sleep pattern or metal implant status since previous treatment. Ptpresented withpleasantaffect to txsession.He shared that he had a good day at work after his personal day yesterday. Hewatched TVfor the remainder oftx session.Power levelremained at 120% for the duration of tx sesion.Patient reported nocomplaints anddiscomfort during tx.Patientdeparted post-treatment with no majorreported concerns.

## 2017-12-14 ENCOUNTER — Other Ambulatory Visit (INDEPENDENT_AMBULATORY_CARE_PROVIDER_SITE_OTHER): Payer: BLUE CROSS/BLUE SHIELD | Admitting: Emergency Medicine

## 2017-12-14 DIAGNOSIS — F332 Major depressive disorder, recurrent severe without psychotic features: Secondary | ICD-10-CM | POA: Diagnosis not present

## 2017-12-14 NOTE — Progress Notes (Signed)
[facility] for Repetitive Transcranial Magnetic Stimulation treatment for severe episode of recurrent major depressive disorder, without psychotic features. Patient presented with appropriate affect, level mood and denied any suicidal or homicidal ideations. Patient denies any other current symptoms and remains optimistic with continued TMS treatment. Patient reported no change in alcohol/substane use, caffeine consumption, sleep pattern or metal implant status since previous treatment. Ptpresented withpleasantaffect to txsession.He shared that he had a relaxing weekend and caught up on sleep. He watched TVduringtx session.Power levelremained at 120% for the duration of tx sesion.Patient reported nocomplaints anddiscomfort during tx.Patientdeparted post-treatment with no majorreported concerns.

## 2017-12-15 ENCOUNTER — Other Ambulatory Visit (INDEPENDENT_AMBULATORY_CARE_PROVIDER_SITE_OTHER): Payer: BLUE CROSS/BLUE SHIELD | Admitting: Emergency Medicine

## 2017-12-15 DIAGNOSIS — F332 Major depressive disorder, recurrent severe without psychotic features: Secondary | ICD-10-CM

## 2017-12-15 NOTE — Progress Notes (Signed)
Patient [facility] for Repetitive Transcranial Magnetic Stimulation treatment for severe episode of recurrent major depressive disorder, without psychotic features. Patient presented with appropriate affect, level mood and denied any suicidal or homicidal ideations. Patient denies any other current symptoms and remains optimistic with continued TMS treatment. Patient reported no change in alcohol/substane use, caffeine consumption, sleep pattern or metal implant status since previous treatment. Ptpresented withpleasantaffect to txsession.He shared that he has a busy day at work. He did not get enough sleep last night because he was watching the [employer] finals. Hewatched TVfor the remainder oftx session.Power levelremained at 120% for the duration of tx sesion.Patient reported nocomplaints anddiscomfort during tx.Patientdeparted post-treatment with no majorreported concerns.

## 2017-12-16 ENCOUNTER — Other Ambulatory Visit (INDEPENDENT_AMBULATORY_CARE_PROVIDER_SITE_OTHER): Payer: BLUE CROSS/BLUE SHIELD | Admitting: Emergency Medicine

## 2017-12-16 DIAGNOSIS — F332 Major depressive disorder, recurrent severe without psychotic features: Secondary | ICD-10-CM | POA: Diagnosis not present

## 2017-12-16 NOTE — Progress Notes (Signed)
Patient reported to [facility] for Repetitive Transcranial Magnetic Stimulation treatment for severe episode of recurrent major depressive disorder, without psychotic features. Patient presented with appropriate affect, level mood and denied any suicidal or homicidal ideations. Patient denies any other current symptoms and remains optimistic with continued TMS treatment. Patient reported no change in alcohol/substane use, caffeine consumption, sleep pattern or metal implant status since previous treatment. Ptpresented withpleasantaffect to txsession.He shared that he received compliments about his work [employer] from his Merchandiser, retailsupervisor. However, he felt awkward and uncomfortable.  Treatment was paused at 28:00 because he fel like he needed to vomit. He went to restroom and stated it was a false alarm. He watched TVremainder oftx session.Power levelremained at 120% for the duration of tx sesion.Patient reported nocomplaints anddiscomfort during tx.Patientdeparted post-treatment with no majorreported concerns.

## 2017-12-17 ENCOUNTER — Encounter (HOSPITAL_COMMUNITY): Payer: Self-pay

## 2017-12-18 ENCOUNTER — Other Ambulatory Visit (INDEPENDENT_AMBULATORY_CARE_PROVIDER_SITE_OTHER): Payer: BLUE CROSS/BLUE SHIELD | Admitting: Emergency Medicine

## 2017-12-18 DIAGNOSIS — F332 Major depressive disorder, recurrent severe without psychotic features: Secondary | ICD-10-CM | POA: Diagnosis not present

## 2017-12-18 NOTE — Progress Notes (Signed)
Patient [facility] for Repetitive Transcranial Magnetic Stimulation treatment for severe episode of recurrent major depressive disorder, without psychotic features. Patient presented with appropriate affect, level mood and denied any suicidal or homicidal ideations. Patient denies any other current symptoms and remains optimistic with continued TMS treatment. Patient reported no change in alcohol/substane use, caffeine consumption, sleep pattern or metal implant status since previous treatment. Ptpresented withpleasantaffect to txsession.He shared that he was not feeling well yesterday. Hewatched TV duringtx session.Power levelremained at 120% for the duration of tx sesion.Patient reported nocomplaints anddiscomfort during tx.Patientdeparted post-treatment with no majorreported concerns.

## 2017-12-21 ENCOUNTER — Other Ambulatory Visit (INDEPENDENT_AMBULATORY_CARE_PROVIDER_SITE_OTHER): Payer: BLUE CROSS/BLUE SHIELD | Admitting: Emergency Medicine

## 2017-12-21 DIAGNOSIS — F332 Major depressive disorder, recurrent severe without psychotic features: Secondary | ICD-10-CM | POA: Diagnosis not present

## 2017-12-21 NOTE — Progress Notes (Signed)
Patient reported [facility] for Repetitive Transcranial Magnetic Stimulation treatment for severe episode of recurrent major depressive disorder, without psychotic features. Patient presented with appropriate affect, level mood and denied any suicidal or homicidal ideations. Patient denies any other current symptoms and remains optimistic with continued TMS treatment. Patient reported no change in alcohol/substane use, caffeine consumption, sleep pattern or metal implant status since previous treatment. Ptpresented withpleasantaffect to txsession.Heshared that he had a pleasantly surprising Father's Day weekend. His wife planned a [place]  [employer] and purchased a Psychologist, clinicalgolf getaway package. He stated it was much needed for the two and they enjoyed themselves.  He watched TVduring tx session.Power levelremained at 120% for the duration of tx sesion.Patient reported nocomplaints anddiscomfort during tx.Patientdeparted post-treatment with no majorreported concerns.

## 2017-12-22 ENCOUNTER — Other Ambulatory Visit (INDEPENDENT_AMBULATORY_CARE_PROVIDER_SITE_OTHER): Payer: BLUE CROSS/BLUE SHIELD | Admitting: Emergency Medicine

## 2017-12-22 DIAGNOSIS — F332 Major depressive disorder, recurrent severe without psychotic features: Secondary | ICD-10-CM

## 2017-12-22 NOTE — Progress Notes (Signed)
Patient reported [facility] for Repetitive Transcranial Magnetic Stimulation treatment for severe episode of recurrent major depressive disorder, without psychotic features. Patient presented with appropriate affect, level mood and denied any suicidal or homicidal ideations. Patient denies any other current symptoms and remains optimistic with continued TMS treatment. Patient reported no change in alcohol/substane use, caffeine consumption, sleep pattern or metal implant status since previous treatment. Ptpresented withpleasantaffect to txsession. He shared that he is having a busy day at work today. Hewatched TV duringtx session.Power levelremained at 120% for the duration of tx sesion.Patient reported nocomplaints anddiscomfort during tx.Patientdeparted post-treatment with no majorreported concerns.

## 2017-12-23 ENCOUNTER — Other Ambulatory Visit (INDEPENDENT_AMBULATORY_CARE_PROVIDER_SITE_OTHER): Payer: BLUE CROSS/BLUE SHIELD | Admitting: Emergency Medicine

## 2017-12-23 DIAGNOSIS — F332 Major depressive disorder, recurrent severe without psychotic features: Secondary | ICD-10-CM | POA: Diagnosis not present

## 2017-12-23 NOTE — Progress Notes (Signed)
Patient reported to [facility] for Repetitive Transcranial Magnetic Stimulation treatment for severe episode of recurrent major depressive disorder, without psychotic features. Patient presented with appropriate affect, level mood and denied any suicidal or homicidal ideations. Patient denies any other current symptoms and remains optimistic with continued TMS treatment. Patient reported no change in alcohol/substane use, caffeine consumption, sleep pattern or metal implant status since previous treatment. Ptpresented withpleasantaffect to txsession.He was quiet during tx and watched TV.Power levelremained at 120% for the duration of tx sesion.Patient reported nocomplaints anddiscomfort during tx.Patientdeparted post-treatment with no majorreported concerns.  

## 2017-12-24 ENCOUNTER — Other Ambulatory Visit (INDEPENDENT_AMBULATORY_CARE_PROVIDER_SITE_OTHER): Payer: BLUE CROSS/BLUE SHIELD | Admitting: Emergency Medicine

## 2017-12-24 DIAGNOSIS — F332 Major depressive disorder, recurrent severe without psychotic features: Secondary | ICD-10-CM

## 2017-12-24 NOTE — Progress Notes (Signed)
[facility] for Repetitive Transcranial Magnetic Stimulation treatment for severe episode of recurrent major depressive disorder, without psychotic features. Patient presented with appropriate affect, level mood and denied any suicidal or homicidal ideations. Patient denies any other current symptoms and remains optimistic with continued TMS treatment. Patient reported no change in alcohol/substane use, caffeine consumption, sleep pattern or metal implant status since previous treatment. Ptpresented withpleasantaffect to txsession.He shared that he has not noticed any improvements in his quality of life. He was quiet during tx and watched TV.Power levelremained at 120% for the duration of tx sesion.Patient reported nocomplaints anddiscomfort during tx.Patientdeparted post-treatment with no majorreported concerns.

## 2017-12-25 ENCOUNTER — Other Ambulatory Visit (INDEPENDENT_AMBULATORY_CARE_PROVIDER_SITE_OTHER): Payer: BLUE CROSS/BLUE SHIELD | Admitting: Emergency Medicine

## 2017-12-25 DIAGNOSIS — F332 Major depressive disorder, recurrent severe without psychotic features: Secondary | ICD-10-CM

## 2017-12-25 NOTE — Progress Notes (Signed)
[facility] for Repetitive Transcranial Magnetic Stimulation treatment for severe episode of recurrent major depressive disorder, without psychotic features. Patient presented with appropriate affect, level mood and denied any suicidal or homicidal ideations. Patient denies any other current symptoms and remains optimistic with continued TMS treatment. Patient reported no change in alcohol/substane use, caffeine consumption, sleep pattern or metal implant status since previous treatment. Ptpresented withpleasantaffect to txsession.He was quiet during tx and watched TV.Power levelremained at 120% for the duration of tx sesion.Patient reported nocomplaints anddiscomfort during tx.Patientdeparted post-treatment with no majorreported concerns.

## 2017-12-28 ENCOUNTER — Other Ambulatory Visit (INDEPENDENT_AMBULATORY_CARE_PROVIDER_SITE_OTHER): Payer: BLUE CROSS/BLUE SHIELD | Admitting: Emergency Medicine

## 2017-12-28 DIAGNOSIS — F332 Major depressive disorder, recurrent severe without psychotic features: Secondary | ICD-10-CM | POA: Diagnosis not present

## 2017-12-28 NOTE — Progress Notes (Signed)
[facility] for Repetitive Transcranial Magnetic Stimulation treatment for severe episode of recurrent major depressive disorder, without psychotic features. Patient presented with appropriate affect, level mood and denied any suicidal or homicidal ideations. Patient denies any other current symptoms and remains optimistic with continued TMS treatment. Patient reported no change in alcohol/substane use, caffeine consumption, sleep pattern or metal implant status since previous treatment. Ptpresented withpleasantaffect to txsession.He brought headphones to listen to music. He shared he had a good weekend spending quality time with his family.  Power levelremained at 120% for the duration of tx sesion.Patient reported nocomplaints anddiscomfort during tx.Patientdeparted post-treatment with no majorreported concerns.

## 2017-12-29 ENCOUNTER — Other Ambulatory Visit (INDEPENDENT_AMBULATORY_CARE_PROVIDER_SITE_OTHER): Payer: BLUE CROSS/BLUE SHIELD | Admitting: Emergency Medicine

## 2017-12-29 DIAGNOSIS — F332 Major depressive disorder, recurrent severe without psychotic features: Secondary | ICD-10-CM

## 2017-12-29 NOTE — Progress Notes (Signed)
[facility] for Repetitive Transcranial Magnetic Stimulation treatment for severe episode of recurrent major depressive disorder, without psychotic features. Patient presented with appropriate affect, level mood and denied any suicidal or homicidal ideations. Patient denies any other current symptoms and remains optimistic with continued TMS treatment. Patient reported no change in alcohol/substane use, caffeine consumption, sleep pattern or metal implant status since previous treatment. Ptpresented withpleasantaffect to txsession.He brought his headphones to listen to music. Also, he watched TV during tx session.Power levelremained at 120% for the duration of tx sesion.Patient reported nocomplaints anddiscomfort during tx.Patientdeparted post-treatment with no majorreported concerns.

## 2017-12-30 ENCOUNTER — Other Ambulatory Visit (INDEPENDENT_AMBULATORY_CARE_PROVIDER_SITE_OTHER): Payer: BLUE CROSS/BLUE SHIELD | Admitting: Emergency Medicine

## 2017-12-30 DIAGNOSIS — F332 Major depressive disorder, recurrent severe without psychotic features: Secondary | ICD-10-CM

## 2017-12-30 NOTE — Progress Notes (Signed)
[facility] for Repetitive Transcranial Magnetic Stimulation treatment for severe episode of recurrent major depressive disorder, without psychotic features. Patient presented with appropriate affect, level mood and denied any suicidal or homicidal ideations. Patient denies any other current symptoms and remains optimistic with continued TMS treatment. Patient reported no change in alcohol/substane use, caffeine consumption, sleep pattern or metal implant status since previous treatment. Ptpresented withpleasantaffect to txsession.He shared that he enjoys coming to [facility] to get a break from work and be able to watch [employer]. He watched TV during tx session. Power levelremained at 120% for the duration of tx sesion.Patient reported nocomplaints anddiscomfort during tx.Patientdeparted post-treatment with no majorreported concerns.

## 2017-12-31 ENCOUNTER — Other Ambulatory Visit (INDEPENDENT_AMBULATORY_CARE_PROVIDER_SITE_OTHER): Payer: BLUE CROSS/BLUE SHIELD | Admitting: Emergency Medicine

## 2017-12-31 DIAGNOSIS — F332 Major depressive disorder, recurrent severe without psychotic features: Secondary | ICD-10-CM | POA: Diagnosis not present

## 2017-12-31 NOTE — Progress Notes (Signed)
  [facility] for Repetitive Transcranial Magnetic Stimulation treatment for severe episode of recurrent major depressive disorder, without psychotic features. Patient presented with appropriate affect, level mood and denied any suicidal or homicidal ideations. Patient denies any other current symptoms and remains optimistic with continued TMS treatment. Patient reported no change in alcohol/substane use, caffeine consumption, sleep pattern or metal implant status since previous treatment. Ptpresented withpleasantaffect to txsession.He shared that he had attended a dinner gathering with his parents, which he normally would not have been opened to attending. Also, he went to the water park with his family and enjoyed spending quality time with his kids. He believes that TMS has been improving his quality of life. He watched TV during tx session.Power levelremained at 120% for the duration of tx sesion.Patient reported nocomplaints anddiscomfort during tx.Patientdeparted post-treatment with no majorreported concerns.

## 2018-01-01 ENCOUNTER — Encounter (HOSPITAL_COMMUNITY): Payer: Self-pay | Admitting: Emergency Medicine

## 2018-01-04 ENCOUNTER — Other Ambulatory Visit (HOSPITAL_COMMUNITY): Payer: BLUE CROSS/BLUE SHIELD | Attending: Psychiatry | Admitting: Emergency Medicine

## 2018-01-04 DIAGNOSIS — F332 Major depressive disorder, recurrent severe without psychotic features: Secondary | ICD-10-CM

## 2018-01-04 NOTE — Progress Notes (Signed)
Patient [facility] for Repetitive Transcranial Magnetic Stimulation treatment for severe episode of recurrent major depressive disorder, without psychotic features. Patient presented with appropriate affect, level mood and denied any suicidal or homicidal ideations. Patient denies any other current symptoms and remains optimistic with continued TMS treatment. Patient reported no change in alcohol/substane use, caffeine consumption, sleep pattern or metal implant status since previous treatment. Ptpresented withpleasantaffect to txsession.He shared that he is having a stressful day at work - last minute conference calls and abundance of paperwork. He watched TV during tx session.Power levelremained at 120% for the duration of tx sesion.Patient reported nocomplaints anddiscomfort during tx.Patientdeparted post-treatment with no majorreported concerns.

## 2018-01-05 ENCOUNTER — Other Ambulatory Visit (INDEPENDENT_AMBULATORY_CARE_PROVIDER_SITE_OTHER): Payer: BLUE CROSS/BLUE SHIELD | Admitting: Emergency Medicine

## 2018-01-05 DIAGNOSIS — F332 Major depressive disorder, recurrent severe without psychotic features: Secondary | ICD-10-CM | POA: Diagnosis not present

## 2018-01-05 NOTE — Progress Notes (Signed)
[facility] for Repetitive Transcranial Magnetic Stimulation treatment for severe episode of recurrent major depressive disorder, without psychotic features. Patient presented with appropriate affect, level mood and denied any suicidal or homicidal ideations. Patient denies any other current symptoms and remains optimistic with continued TMS treatment. Patient reported no change in alcohol/substane use, caffeine consumption, sleep pattern or metal implant status since previous treatment. Ptpresented withpleasantaffect to txsession.He brought headphones and listened to music. He also periodically watched TV during tx session.Power levelremained at 120% for the duration of tx sesion.Patient reported nocomplaints anddiscomfort during tx.Patientdeparted post-treatment with no majorreported concerns.

## 2018-01-06 ENCOUNTER — Encounter (HOSPITAL_COMMUNITY): Payer: Self-pay | Admitting: Psychiatry

## 2018-01-06 ENCOUNTER — Encounter (HOSPITAL_COMMUNITY): Payer: Self-pay | Admitting: Emergency Medicine

## 2018-01-06 NOTE — Telephone Encounter (Signed)
fyi

## 2018-01-07 ENCOUNTER — Encounter (HOSPITAL_COMMUNITY): Payer: Self-pay

## 2018-01-08 ENCOUNTER — Encounter (HOSPITAL_COMMUNITY): Payer: Self-pay

## 2018-01-11 ENCOUNTER — Other Ambulatory Visit (INDEPENDENT_AMBULATORY_CARE_PROVIDER_SITE_OTHER): Payer: BLUE CROSS/BLUE SHIELD | Admitting: Emergency Medicine

## 2018-01-11 DIAGNOSIS — F332 Major depressive disorder, recurrent severe without psychotic features: Secondary | ICD-10-CM

## 2018-01-11 NOTE — Progress Notes (Signed)
Patient reported [facility] for Repetitive Transcranial Magnetic Stimulation treatment for severe episode of recurrent major depressive disorder, without psychotic features. Patient presented with appropriate affect, level mood and denied any suicidal or homicidal ideations. Patient denies any other current symptoms and remains optimistic with continued TMS treatment. Patient reported no change in alcohol/substane use, caffeine consumption, sleep pattern or metal implant status since previous treatment. Ptpresented withpleasantaffect to txsession. He shared that he is undergoing a process of firing an employee. Hewatched TV during tx session.Power levelremained at 120% for the duration of tx sesion.Patient reported nocomplaints anddiscomfort during tx.Patientdeparted post-treatment with no majorreported concerns.

## 2018-01-12 ENCOUNTER — Encounter (HOSPITAL_COMMUNITY): Payer: Self-pay

## 2018-01-13 ENCOUNTER — Other Ambulatory Visit (HOSPITAL_COMMUNITY): Payer: BLUE CROSS/BLUE SHIELD | Admitting: Emergency Medicine

## 2018-01-13 DIAGNOSIS — F332 Major depressive disorder, recurrent severe without psychotic features: Secondary | ICD-10-CM

## 2018-01-13 NOTE — Progress Notes (Signed)
[facility] for Repetitive Transcranial Magnetic Stimulation treatment for severe episode of recurrent major depressive disorder, without psychotic features. Patient presented with appropriate affect, level mood and denied any suicidal or homicidal ideations. Patient denies any other current symptoms and remains optimistic with continued TMS treatment. Patient reported no change in alcohol/substane use, caffeine consumption, sleep pattern or metal implant status since previous treatment. Ptpresented withpleasantaffect to txsession.He missed yesterday's tx session because he was unable to leave. He periodicallywatched TV during tx session.Power levelremained at 120% for the duration of tx sesion.Patient reported nocomplaints anddiscomfort during tx.Patientdeparted post-treatment with no majorreported concerns.

## 2018-01-14 ENCOUNTER — Encounter (HOSPITAL_COMMUNITY): Payer: Self-pay | Admitting: Emergency Medicine

## 2018-01-15 ENCOUNTER — Encounter (HOSPITAL_COMMUNITY): Payer: Self-pay | Admitting: Emergency Medicine

## 2018-01-15 ENCOUNTER — Encounter (HOSPITAL_COMMUNITY): Payer: Self-pay

## 2018-01-18 ENCOUNTER — Encounter (HOSPITAL_COMMUNITY): Payer: Self-pay | Admitting: Emergency Medicine

## 2018-01-20 ENCOUNTER — Encounter (HOSPITAL_COMMUNITY): Payer: Self-pay | Admitting: Psychiatry

## 2018-01-20 ENCOUNTER — Encounter (HOSPITAL_COMMUNITY): Payer: Self-pay | Admitting: Emergency Medicine

## 2018-01-20 NOTE — Telephone Encounter (Signed)
fyi

## 2018-01-21 ENCOUNTER — Encounter (HOSPITAL_COMMUNITY): Payer: Self-pay | Admitting: Emergency Medicine

## 2018-01-25 ENCOUNTER — Encounter (HOSPITAL_COMMUNITY): Payer: Self-pay

## 2018-01-26 ENCOUNTER — Encounter (HOSPITAL_COMMUNITY): Payer: Self-pay

## 2018-01-26 DIAGNOSIS — F4310 Post-traumatic stress disorder, unspecified: Secondary | ICD-10-CM | POA: Diagnosis not present

## 2018-02-01 ENCOUNTER — Encounter (HOSPITAL_COMMUNITY): Payer: Self-pay

## 2018-02-09 DIAGNOSIS — F4310 Post-traumatic stress disorder, unspecified: Secondary | ICD-10-CM | POA: Diagnosis not present

## 2018-03-02 DIAGNOSIS — F4310 Post-traumatic stress disorder, unspecified: Secondary | ICD-10-CM | POA: Diagnosis not present

## 2018-07-12 DIAGNOSIS — F4310 Post-traumatic stress disorder, unspecified: Secondary | ICD-10-CM | POA: Diagnosis not present

## 2018-07-20 DIAGNOSIS — F4310 Post-traumatic stress disorder, unspecified: Secondary | ICD-10-CM | POA: Diagnosis not present

## 2018-07-27 DIAGNOSIS — F4310 Post-traumatic stress disorder, unspecified: Secondary | ICD-10-CM | POA: Diagnosis not present

## 2018-08-05 DIAGNOSIS — F4310 Post-traumatic stress disorder, unspecified: Secondary | ICD-10-CM | POA: Diagnosis not present

## 2018-08-08 DIAGNOSIS — J020 Streptococcal pharyngitis: Secondary | ICD-10-CM | POA: Diagnosis not present

## 2018-08-08 DIAGNOSIS — J069 Acute upper respiratory infection, unspecified: Secondary | ICD-10-CM | POA: Diagnosis not present

## 2018-08-12 DIAGNOSIS — F4310 Post-traumatic stress disorder, unspecified: Secondary | ICD-10-CM | POA: Diagnosis not present

## 2018-08-17 DIAGNOSIS — F4310 Post-traumatic stress disorder, unspecified: Secondary | ICD-10-CM | POA: Diagnosis not present

## 2018-08-27 DIAGNOSIS — F4310 Post-traumatic stress disorder, unspecified: Secondary | ICD-10-CM | POA: Diagnosis not present

## 2018-09-02 DIAGNOSIS — F4310 Post-traumatic stress disorder, unspecified: Secondary | ICD-10-CM | POA: Diagnosis not present

## 2018-09-08 DIAGNOSIS — F4310 Post-traumatic stress disorder, unspecified: Secondary | ICD-10-CM | POA: Diagnosis not present

## 2018-09-14 DIAGNOSIS — F4310 Post-traumatic stress disorder, unspecified: Secondary | ICD-10-CM | POA: Diagnosis not present

## 2018-09-27 DIAGNOSIS — F4310 Post-traumatic stress disorder, unspecified: Secondary | ICD-10-CM | POA: Diagnosis not present

## 2018-09-28 DIAGNOSIS — F4310 Post-traumatic stress disorder, unspecified: Secondary | ICD-10-CM | POA: Diagnosis not present

## 2018-10-26 DIAGNOSIS — F4310 Post-traumatic stress disorder, unspecified: Secondary | ICD-10-CM | POA: Diagnosis not present

## 2018-11-18 DIAGNOSIS — R0600 Dyspnea, unspecified: Secondary | ICD-10-CM | POA: Diagnosis not present

## 2018-11-18 DIAGNOSIS — J020 Streptococcal pharyngitis: Secondary | ICD-10-CM | POA: Diagnosis not present

## 2018-11-18 DIAGNOSIS — J101 Influenza due to other identified influenza virus with other respiratory manifestations: Secondary | ICD-10-CM | POA: Diagnosis not present

## 2018-11-18 DIAGNOSIS — R05 Cough: Secondary | ICD-10-CM | POA: Diagnosis not present

## 2018-12-13 DIAGNOSIS — F4310 Post-traumatic stress disorder, unspecified: Secondary | ICD-10-CM | POA: Diagnosis not present

## 2018-12-21 DIAGNOSIS — F4310 Post-traumatic stress disorder, unspecified: Secondary | ICD-10-CM | POA: Diagnosis not present

## 2018-12-28 DIAGNOSIS — F4310 Post-traumatic stress disorder, unspecified: Secondary | ICD-10-CM | POA: Diagnosis not present

## 2019-01-03 DIAGNOSIS — F4310 Post-traumatic stress disorder, unspecified: Secondary | ICD-10-CM | POA: Diagnosis not present

## 2019-01-12 DIAGNOSIS — F4310 Post-traumatic stress disorder, unspecified: Secondary | ICD-10-CM | POA: Diagnosis not present

## 2019-01-19 DIAGNOSIS — F4310 Post-traumatic stress disorder, unspecified: Secondary | ICD-10-CM | POA: Diagnosis not present

## 2019-01-24 DIAGNOSIS — F4310 Post-traumatic stress disorder, unspecified: Secondary | ICD-10-CM | POA: Diagnosis not present

## 2019-01-25 DIAGNOSIS — F411 Generalized anxiety disorder: Secondary | ICD-10-CM | POA: Diagnosis not present

## 2019-01-25 DIAGNOSIS — F4310 Post-traumatic stress disorder, unspecified: Secondary | ICD-10-CM | POA: Diagnosis not present

## 2019-01-25 DIAGNOSIS — F4312 Post-traumatic stress disorder, chronic: Secondary | ICD-10-CM | POA: Diagnosis not present

## 2019-01-25 DIAGNOSIS — F902 Attention-deficit hyperactivity disorder, combined type: Secondary | ICD-10-CM | POA: Diagnosis not present

## 2019-01-25 DIAGNOSIS — F3181 Bipolar II disorder: Secondary | ICD-10-CM | POA: Diagnosis not present

## 2019-02-02 DIAGNOSIS — F4310 Post-traumatic stress disorder, unspecified: Secondary | ICD-10-CM | POA: Diagnosis not present

## 2019-02-09 DIAGNOSIS — F4310 Post-traumatic stress disorder, unspecified: Secondary | ICD-10-CM | POA: Diagnosis not present

## 2019-02-23 DIAGNOSIS — F4310 Post-traumatic stress disorder, unspecified: Secondary | ICD-10-CM | POA: Diagnosis not present

## 2019-03-02 DIAGNOSIS — F4310 Post-traumatic stress disorder, unspecified: Secondary | ICD-10-CM | POA: Diagnosis not present

## 2019-03-09 DIAGNOSIS — F4310 Post-traumatic stress disorder, unspecified: Secondary | ICD-10-CM | POA: Diagnosis not present

## 2019-03-16 DIAGNOSIS — F4310 Post-traumatic stress disorder, unspecified: Secondary | ICD-10-CM | POA: Diagnosis not present

## 2019-03-23 DIAGNOSIS — F4310 Post-traumatic stress disorder, unspecified: Secondary | ICD-10-CM | POA: Diagnosis not present

## 2019-03-30 DIAGNOSIS — F4310 Post-traumatic stress disorder, unspecified: Secondary | ICD-10-CM | POA: Diagnosis not present

## 2019-04-06 DIAGNOSIS — F4310 Post-traumatic stress disorder, unspecified: Secondary | ICD-10-CM | POA: Diagnosis not present

## 2019-04-13 DIAGNOSIS — F4310 Post-traumatic stress disorder, unspecified: Secondary | ICD-10-CM | POA: Diagnosis not present

## 2019-04-20 DIAGNOSIS — F4310 Post-traumatic stress disorder, unspecified: Secondary | ICD-10-CM | POA: Diagnosis not present

## 2019-04-25 DIAGNOSIS — F3181 Bipolar II disorder: Secondary | ICD-10-CM | POA: Diagnosis not present

## 2019-04-25 DIAGNOSIS — F4310 Post-traumatic stress disorder, unspecified: Secondary | ICD-10-CM | POA: Diagnosis not present

## 2019-04-25 DIAGNOSIS — F411 Generalized anxiety disorder: Secondary | ICD-10-CM | POA: Diagnosis not present

## 2019-04-25 DIAGNOSIS — F4312 Post-traumatic stress disorder, chronic: Secondary | ICD-10-CM | POA: Diagnosis not present

## 2019-04-25 DIAGNOSIS — F902 Attention-deficit hyperactivity disorder, combined type: Secondary | ICD-10-CM | POA: Diagnosis not present

## 2019-04-27 DIAGNOSIS — F4310 Post-traumatic stress disorder, unspecified: Secondary | ICD-10-CM | POA: Diagnosis not present

## 2019-05-04 DIAGNOSIS — F4310 Post-traumatic stress disorder, unspecified: Secondary | ICD-10-CM | POA: Diagnosis not present

## 2019-05-18 DIAGNOSIS — F4310 Post-traumatic stress disorder, unspecified: Secondary | ICD-10-CM | POA: Diagnosis not present

## 2019-05-25 DIAGNOSIS — F4310 Post-traumatic stress disorder, unspecified: Secondary | ICD-10-CM | POA: Diagnosis not present

## 2019-06-08 DIAGNOSIS — F4310 Post-traumatic stress disorder, unspecified: Secondary | ICD-10-CM | POA: Diagnosis not present

## 2019-06-15 DIAGNOSIS — F4310 Post-traumatic stress disorder, unspecified: Secondary | ICD-10-CM | POA: Diagnosis not present

## 2019-06-22 DIAGNOSIS — F4310 Post-traumatic stress disorder, unspecified: Secondary | ICD-10-CM | POA: Diagnosis not present

## 2019-06-29 DIAGNOSIS — F4310 Post-traumatic stress disorder, unspecified: Secondary | ICD-10-CM | POA: Diagnosis not present

## 2019-07-06 DIAGNOSIS — F4310 Post-traumatic stress disorder, unspecified: Secondary | ICD-10-CM | POA: Diagnosis not present

## 2019-07-13 DIAGNOSIS — F4310 Post-traumatic stress disorder, unspecified: Secondary | ICD-10-CM | POA: Diagnosis not present

## 2019-07-20 DIAGNOSIS — F4310 Post-traumatic stress disorder, unspecified: Secondary | ICD-10-CM | POA: Diagnosis not present

## 2019-07-27 DIAGNOSIS — F902 Attention-deficit hyperactivity disorder, combined type: Secondary | ICD-10-CM | POA: Diagnosis not present

## 2019-07-27 DIAGNOSIS — F411 Generalized anxiety disorder: Secondary | ICD-10-CM | POA: Diagnosis not present

## 2019-07-27 DIAGNOSIS — F3181 Bipolar II disorder: Secondary | ICD-10-CM | POA: Diagnosis not present

## 2019-07-27 DIAGNOSIS — F4312 Post-traumatic stress disorder, chronic: Secondary | ICD-10-CM | POA: Diagnosis not present

## 2019-07-27 DIAGNOSIS — F4310 Post-traumatic stress disorder, unspecified: Secondary | ICD-10-CM | POA: Diagnosis not present

## 2019-08-03 DIAGNOSIS — F4310 Post-traumatic stress disorder, unspecified: Secondary | ICD-10-CM | POA: Diagnosis not present

## 2019-08-11 DIAGNOSIS — F4310 Post-traumatic stress disorder, unspecified: Secondary | ICD-10-CM | POA: Diagnosis not present

## 2019-08-17 DIAGNOSIS — F4310 Post-traumatic stress disorder, unspecified: Secondary | ICD-10-CM | POA: Diagnosis not present

## 2019-08-24 DIAGNOSIS — F4310 Post-traumatic stress disorder, unspecified: Secondary | ICD-10-CM | POA: Diagnosis not present

## 2019-08-31 DIAGNOSIS — F4310 Post-traumatic stress disorder, unspecified: Secondary | ICD-10-CM | POA: Diagnosis not present

## 2019-09-05 DIAGNOSIS — F902 Attention-deficit hyperactivity disorder, combined type: Secondary | ICD-10-CM | POA: Diagnosis not present

## 2019-09-05 DIAGNOSIS — F4310 Post-traumatic stress disorder, unspecified: Secondary | ICD-10-CM | POA: Diagnosis not present

## 2019-09-05 DIAGNOSIS — F4312 Post-traumatic stress disorder, chronic: Secondary | ICD-10-CM | POA: Diagnosis not present

## 2019-09-05 DIAGNOSIS — F3181 Bipolar II disorder: Secondary | ICD-10-CM | POA: Diagnosis not present

## 2019-09-05 DIAGNOSIS — F411 Generalized anxiety disorder: Secondary | ICD-10-CM | POA: Diagnosis not present

## 2019-09-07 DIAGNOSIS — F4310 Post-traumatic stress disorder, unspecified: Secondary | ICD-10-CM | POA: Diagnosis not present

## 2019-09-14 DIAGNOSIS — F4310 Post-traumatic stress disorder, unspecified: Secondary | ICD-10-CM | POA: Diagnosis not present

## 2019-09-21 DIAGNOSIS — F4310 Post-traumatic stress disorder, unspecified: Secondary | ICD-10-CM | POA: Diagnosis not present

## 2019-09-28 DIAGNOSIS — F4310 Post-traumatic stress disorder, unspecified: Secondary | ICD-10-CM | POA: Diagnosis not present

## 2019-10-05 DIAGNOSIS — F4310 Post-traumatic stress disorder, unspecified: Secondary | ICD-10-CM | POA: Diagnosis not present

## 2019-10-12 DIAGNOSIS — F4310 Post-traumatic stress disorder, unspecified: Secondary | ICD-10-CM | POA: Diagnosis not present

## 2019-10-18 DIAGNOSIS — F411 Generalized anxiety disorder: Secondary | ICD-10-CM | POA: Diagnosis not present

## 2019-10-18 DIAGNOSIS — F3181 Bipolar II disorder: Secondary | ICD-10-CM | POA: Diagnosis not present

## 2019-10-18 DIAGNOSIS — F4310 Post-traumatic stress disorder, unspecified: Secondary | ICD-10-CM | POA: Diagnosis not present

## 2019-10-18 DIAGNOSIS — F902 Attention-deficit hyperactivity disorder, combined type: Secondary | ICD-10-CM | POA: Diagnosis not present

## 2019-10-18 DIAGNOSIS — F4312 Post-traumatic stress disorder, chronic: Secondary | ICD-10-CM | POA: Diagnosis not present

## 2019-10-19 DIAGNOSIS — F4310 Post-traumatic stress disorder, unspecified: Secondary | ICD-10-CM | POA: Diagnosis not present

## 2019-10-27 DIAGNOSIS — F4310 Post-traumatic stress disorder, unspecified: Secondary | ICD-10-CM | POA: Diagnosis not present

## 2019-11-09 DIAGNOSIS — F4310 Post-traumatic stress disorder, unspecified: Secondary | ICD-10-CM | POA: Diagnosis not present

## 2019-11-16 DIAGNOSIS — F4310 Post-traumatic stress disorder, unspecified: Secondary | ICD-10-CM | POA: Diagnosis not present

## 2019-11-23 DIAGNOSIS — F4310 Post-traumatic stress disorder, unspecified: Secondary | ICD-10-CM | POA: Diagnosis not present

## 2019-11-30 DIAGNOSIS — F4310 Post-traumatic stress disorder, unspecified: Secondary | ICD-10-CM | POA: Diagnosis not present

## 2019-12-01 ENCOUNTER — Encounter: Payer: Self-pay | Admitting: Family Medicine

## 2019-12-01 ENCOUNTER — Other Ambulatory Visit: Payer: Self-pay

## 2019-12-01 ENCOUNTER — Ambulatory Visit (INDEPENDENT_AMBULATORY_CARE_PROVIDER_SITE_OTHER): Payer: BC Managed Care – PPO | Admitting: Family Medicine

## 2019-12-01 VITALS — BP 148/97 | HR 97 | Temp 97.7°F | Ht 74.0 in | Wt 278.0 lb

## 2019-12-01 DIAGNOSIS — I10 Essential (primary) hypertension: Secondary | ICD-10-CM | POA: Diagnosis not present

## 2019-12-01 DIAGNOSIS — R0789 Other chest pain: Secondary | ICD-10-CM

## 2019-12-01 DIAGNOSIS — Z23 Encounter for immunization: Secondary | ICD-10-CM | POA: Diagnosis not present

## 2019-12-01 MED ORDER — LISINOPRIL-HYDROCHLOROTHIAZIDE 10-12.5 MG PO TABS: 1.0000 | ORAL_TABLET | Freq: Every day | ORAL | 3 refills | Status: DC

## 2019-12-01 NOTE — Progress Notes (Signed)
Patient ID: Derek Mckinney, male    DOB: July 22, 1980  Age: 39 y.o. MRN: [id number]  Chief Complaint  Patient presents with  . Hypertension    Pt stated that he has been noticing that his BP has been running at home. He has a BP cuff at home that he keeps a check with.    Subjective:   39 year old man who is here for a blood pressure check.  He is been running high when he goes to his counselor's office.  Often in the 160s to 180s.  P has some vague left axillary chest pains when he rakes leaves or moves in certain positions.  He says he gets tender to touch.  No substernal pains.  No excessive headaches or shortness of breath.  He  He does have a history of having used substances in the past and been opioid dependent, clean for 3 years.  He regularly goes to his support group.  He is with his wife of 20 or 12 years who is supportive.  They have 2 children 34 and 41 years old.  He has been adverse to getting exercise in the past though they have started walking together recently this week.  He did not have a good relationship with his family and he really does not know their health histories.  It has been a while since he has had labs done.  His medicine list is up-to-date with Rexulti, Vyvanse, Lexapro.  Current allergies, medications, problem list, past/family and social histories reviewed.  Objective:  BP (!) 148/97 (BP Location: Left Arm, Patient Position: Sitting, Cuff Size: Large)   Pulse 97   Temp 97.7 F (36.5 C) (Temporal)   Ht '6\' 2"'  (1.88 m)   Wt 278 lb (126.1 kg)   SpO2 99%   BMI 35.69 kg/m  No acute distress.  Large man.  TMs normal.  Throat clear.  Neck supple without nodes.  Chest clear.  Heart regular without murmur.  No thyromegaly.  Abdomen soft and nontender.  Extremities without edema.  Minimal edema ring from his socks.  Assessment & Plan:   Assessment: 1. Need for Tdap vaccination   2. Essential hypertension   3. Chest wall pain       Plan: We will check  labs and an EKG and begin him on blood pressure medicine.  We will follow up with Maximiano Coss at the scheduled time.  The EKG machine is not working today since the computers were down.  We will get him back over here to do a baseline EKG as soon as the machine is working.  He works just 5 minutes from here  Orders Placed This Encounter  Procedures  . Tdap vaccine greater than or equal to 7yo IM  . CMP14+EGFR  . TSH  . CBC with Differential  . Lipid panel  . EKG 12-Lead    Meds ordered this encounter  Medications  . lisinopril-hydrochlorothiazide (ZESTORETIC) 10-12.5 MG tablet    Sig: Take 1 tablet by mouth daily.    Dispense:  90 tablet    Refill:  3         Patient Instructions   Take lisinopril 10/12.5 mg 1 each morning for blood pressure.  If you ever develop significant chest pains go to emergency room immediately or call 911.  It is important to work on the regular exercise and try to lose some weight by eating less and avoiding any liquid calories.  I commend you for the life changes  you have made and hope that your able to make lifestyle changes in the future to get your body in better physical shape.    If you have lab work done today you will be contacted with your lab results within the next 2 weeks.  If you have not heard from [place] then please contact us. The fastest way to get your results is to register for My Chart.   IF you received an x-ray today, you will receive an invoice from [facility]. Please contact [facility] at [phone] with questions or concerns regarding your invoice.   IF you received labwork today, you will receive an invoice from [facility]. Please contact [facility] at [phone] with questions or concerns regarding your invoice.   Our billing staff will not be able to assist you with questions regarding bills from these companies.  You will be contacted with the lab results as soon as they are available. The  fastest way to get your results is to activate your My Chart account. Instructions are located on the last page of this paperwork. If you have not heard from [place] regarding the results in 2 weeks, please contact this office.        Return Keep appointment with nurse practitioner Maximiano Coss, for Blood pressure.   Ruben Reason, MD 12/01/2019

## 2019-12-01 NOTE — Patient Instructions (Addendum)
Take lisinopril 10/12.5 mg 1 each morning for blood pressure.  If you ever develop significant chest pains go to emergency room immediately or call 911.  It is important to work on the regular exercise and try to lose some weight by eating less and avoiding any liquid calories.  I commend you for the life changes you have made and hope that your able to make lifestyle changes in the future to get your body in better physical shape.    If you have lab work done today you will be contacted with your lab results within the next 2 weeks.  If you have not heard from [place] then please contact us. The fastest way to get your results is to register for My Chart.   IF you received an x-ray today, you will receive an invoice from [facility]. Please contact [facility] at [phone] with questions or concerns regarding your invoice.   IF you received labwork today, you will receive an invoice from [facility]. Please contact [facility] at [phone] with questions or concerns regarding your invoice.   Our billing staff will not be able to assist you with questions regarding bills from these companies.  You will be contacted with the lab results as soon as they are available. The fastest way to get your results is to activate your My Chart account. Instructions are located on the last page of this paperwork. If you have not heard from [place] regarding the results in 2 weeks, please contact this office.

## 2019-12-02 ENCOUNTER — Telehealth: Payer: Self-pay

## 2019-12-02 ENCOUNTER — Other Ambulatory Visit: Payer: Self-pay | Admitting: Family Medicine

## 2019-12-02 DIAGNOSIS — E7800 Pure hypercholesterolemia, unspecified: Secondary | ICD-10-CM

## 2019-12-02 LAB — CBC WITH DIFFERENTIAL/PLATELET
Basophils Absolute: 0.1 10*3/uL (ref 0.0–0.2)
Basos: 1 %
EOS (ABSOLUTE): 0.1 10*3/uL (ref 0.0–0.4)
Eos: 1 %
Hematocrit: 46.7 % (ref 37.5–51.0)
Hemoglobin: 14.4 g/dL (ref 13.0–17.7)
Immature Grans (Abs): 0 10*3/uL (ref 0.0–0.1)
Immature Granulocytes: 0 %
Lymphocytes Absolute: 1.8 10*3/uL (ref 0.7–3.1)
Lymphs: 23 %
MCH: 23.5 pg — ABNORMAL LOW (ref 26.6–33.0)
MCHC: 30.8 g/dL — ABNORMAL LOW (ref 31.5–35.7)
MCV: 76 fL — ABNORMAL LOW (ref 79–97)
Monocytes Absolute: 0.5 10*3/uL (ref 0.1–0.9)
Monocytes: 7 %
Neutrophils Absolute: 5.1 10*3/uL (ref 1.4–7.0)
Neutrophils: 68 %
Platelets: 290 10*3/uL (ref 150–450)
RBC: 6.14 x10E6/uL — ABNORMAL HIGH (ref 4.14–5.80)
RDW: 17.9 % — ABNORMAL HIGH (ref 11.6–15.4)
WBC: 7.6 10*3/uL (ref 3.4–10.8)

## 2019-12-02 LAB — CMP14+EGFR
ALT: 21 IU/L (ref 0–44)
AST: 19 IU/L (ref 0–40)
Albumin/Globulin Ratio: 1.3 (ref 1.2–2.2)
Albumin: 4.6 g/dL (ref 4.0–5.0)
Alkaline Phosphatase: 97 IU/L (ref 48–121)
BUN/Creatinine Ratio: 10 (ref 9–20)
BUN: 10 mg/dL (ref 6–20)
Bilirubin Total: 0.4 mg/dL (ref 0.0–1.2)
CO2: 20 mmol/L (ref 20–29)
Calcium: 9.4 mg/dL (ref 8.7–10.2)
Chloride: 100 mmol/L (ref 96–106)
Creatinine, Ser: 1.02 mg/dL (ref 0.76–1.27)
GFR calc Af Amer: 107 mL/min/{1.73_m2} (ref >59)
GFR calc non Af Amer: 92 mL/min/{1.73_m2} (ref >59)
Globulin, Total: 3.5 g/dL (ref 1.5–4.5)
Glucose: 75 mg/dL (ref 65–99)
Potassium: 4.2 mmol/L (ref 3.5–5.2)
Sodium: 137 mmol/L (ref 134–144)
Total Protein: 8.1 g/dL (ref 6.0–8.5)

## 2019-12-02 LAB — TSH: TSH: 3.13 u[IU]/mL (ref 0.450–4.500)

## 2019-12-02 LAB — LIPID PANEL
Chol/HDL Ratio: 4.4 ratio (ref 0.0–5.0)
Cholesterol, Total: 236 mg/dL — ABNORMAL HIGH (ref 100–199)
HDL: 54 mg/dL (ref >39)
LDL Chol Calc (NIH): 153 mg/dL — ABNORMAL HIGH (ref 0–99)
Triglycerides: 159 mg/dL — ABNORMAL HIGH (ref 0–149)
VLDL Cholesterol Cal: 29 mg/dL (ref 5–40)

## 2019-12-02 MED ORDER — ROSUVASTATIN CALCIUM 5 MG PO TABS: ORAL_TABLET | ORAL | 3 refills | Status: DC

## 2019-12-02 NOTE — Telephone Encounter (Signed)
LVM for pt to let him know that our EKG is up and back running and that if he has some tome to stop by the office today so that we can perform the EKG from yesterday.

## 2019-12-02 NOTE — Progress Notes (Signed)
Call patient:Cholesterol level is high.  In combination with his blood pressure I think we should go ahead and start him on cholesterol-lowering medication.  Already discussed the need for exercise and working on weight loss.  I believe he already has a follow-up appointment with Janeece Agee.  I sent in a prescription for his cholesterol.Derek Mckinney. Derek Ren, MD

## 2019-12-02 NOTE — Telephone Encounter (Signed)
Spoke with pt and he stated that he will be in Tuesday to do his EKG and also gave his his lab results.

## 2019-12-06 DIAGNOSIS — F4310 Post-traumatic stress disorder, unspecified: Secondary | ICD-10-CM | POA: Diagnosis not present

## 2019-12-06 DIAGNOSIS — F3181 Bipolar II disorder: Secondary | ICD-10-CM | POA: Diagnosis not present

## 2019-12-06 DIAGNOSIS — F902 Attention-deficit hyperactivity disorder, combined type: Secondary | ICD-10-CM | POA: Diagnosis not present

## 2019-12-06 DIAGNOSIS — F4312 Post-traumatic stress disorder, chronic: Secondary | ICD-10-CM | POA: Diagnosis not present

## 2019-12-06 DIAGNOSIS — F411 Generalized anxiety disorder: Secondary | ICD-10-CM | POA: Diagnosis not present

## 2019-12-07 DIAGNOSIS — F4310 Post-traumatic stress disorder, unspecified: Secondary | ICD-10-CM | POA: Diagnosis not present

## 2019-12-14 DIAGNOSIS — F4310 Post-traumatic stress disorder, unspecified: Secondary | ICD-10-CM | POA: Diagnosis not present

## 2019-12-15 ENCOUNTER — Telehealth: Payer: Self-pay

## 2019-12-15 NOTE — Telephone Encounter (Signed)
LVM for pt to return call to the office so that we can still do his EKG from last visit when the machine was not working.

## 2019-12-20 ENCOUNTER — Other Ambulatory Visit: Payer: Self-pay

## 2019-12-20 ENCOUNTER — Encounter: Payer: Self-pay | Admitting: Registered Nurse

## 2019-12-20 ENCOUNTER — Ambulatory Visit (INDEPENDENT_AMBULATORY_CARE_PROVIDER_SITE_OTHER): Payer: BC Managed Care – PPO | Admitting: Registered Nurse

## 2019-12-20 VITALS — BP 125/86 | HR 91 | Temp 97.7°F | Resp 18 | Ht 74.0 in | Wt 263.2 lb

## 2019-12-20 DIAGNOSIS — I10 Essential (primary) hypertension: Secondary | ICD-10-CM

## 2019-12-20 DIAGNOSIS — E7800 Pure hypercholesterolemia, unspecified: Secondary | ICD-10-CM

## 2019-12-20 DIAGNOSIS — Z7689 Persons encountering health services in other specified circumstances: Secondary | ICD-10-CM

## 2019-12-20 NOTE — Patient Instructions (Signed)
° ° ° °  If you have lab work done today you will be contacted with your lab results within the next 2 weeks.  If you have not heard from us then please contact us. The fastest way to get your results is to register for My Chart. ° ° °IF you received an x-ray today, you will receive an invoice from [facility]. Please contact [facility] at [phone] with questions or concerns regarding your invoice.  ° °IF you received labwork today, you will receive an invoice from [facility]. Please contact [facility] at [phone] with questions or concerns regarding your invoice.  ° °Our billing staff will not be able to assist you with questions regarding bills from these companies. ° °You will be contacted with the lab results as soon as they are available. The fastest way to get your results is to activate your My Chart account. Instructions are located on the last page of this paperwork. If you have not heard from us regarding the results in 2 weeks, please contact this office. °  ° ° ° °

## 2019-12-20 NOTE — Progress Notes (Signed)
Established Patient Office Visit  Subjective:  Patient ID: Derek Mckinney, male    DOB: August 11, 1980  Age: 39 y.o. MRN: [id number]  CC:  Chief Complaint  Patient presents with  . Transitions Of Care    Establish care with a different provider and has no other questiins or concerns .    HPI Derek Mckinney presents for visit to establish care and BP follow up  Was seen by Dr. Alwyn Ren in late May - he had been having high BP at his counselors office with systolic in 160-180 range. Some chest pain, but he felt this to be msk as it was reproducible and worse with heavy activity. Unfortunately, EKG machine was down at his last visit.   Feeling much improved since starting lisinopril-hctz. Also started crestor. BP has been lower. Some mild dizziness and mild headache easily relieved with OTCs. Feels that this may be r/t dehydration.  No further concerns today.   Past Medical History:  Diagnosis Date  . Anxiety   . Chronic insomnia   . Depression   . Gout   . Headache(784.0)   . Obesity   . Polysubstance abuse (HCC)   . Seizures (HCC)     Past Surgical History:  Procedure Laterality Date  . BANKART REPAIR  07/22/2011   Procedure: OPEN [name] REPAIR;  Surgeon: [name], MD;  Location: [facility];  Service: Orthopedics;  Laterality: Right;  right shoulder hardware removal, bone grafting of humeral head defect, open bankhardt repair C ARM  . FRACTURE SURGERY    . HARDWARE REMOVAL  07/22/2011   Procedure: HARDWARE REMOVAL;  Surgeon: [name], MD;  Location: [facility];  Service: Orthopedics;  Laterality: Right;  . right shoulder surgery     2012  . widom teeth extraction      as teenager    Family History  Problem Relation Age of Onset  . Hypothyroidism Mother   . Depression Mother   . Physical abuse Mother   . Sexual abuse Mother   . Gout Father   . Lupus Father   . Hyperlipidemia Father   . Alcohol abuse  Father   . Drug abuse Father   . Cancer Maternal Grandmother        lung cancer  . Physical abuse Brother   . Sexual abuse Brother     Social History   Socioeconomic History  . Marital status: Married    Spouse name: Not on file  . Number of children: Not on file  . Years of education: Not on file  . Highest education level: Not on file  Occupational History  . Not on file  Tobacco Use  . Smoking status: Never Smoker  . Smokeless tobacco: Current User    Types: Chew  Vaping Use  . Vaping Use: Never used  Substance and Sexual Activity  . Alcohol use: No  . Drug use: No  . Sexual activity: Yes    Partners: Female    Birth control/protection: None    Comment: married  Other Topics Concern  . Not on file  Social History Narrative  . Not on file   Social Determinants of Health   Financial Resource Strain:   . Difficulty of Paying Living Expenses:   Food Insecurity:   . Worried About Programme researcher, broadcasting/film/video in the Last Year:   . [employer] of Food in the Last Year:   Transportation Needs:   . Lack of  Transportation (Medical):   Marland Kitchen Lack of Transportation (Non-Medical):   Physical Activity:   . Days of Exercise per Week:   . Minutes of Exercise per Session:   Stress:   . Feeling of Stress :   Social Connections:   . Frequency of Communication with Friends and Family:   . Frequency of Social Gatherings with Friends and Family:   . Attends Religious Services:   . Active Member of Clubs or Organizations:   . Attends Banker Meetings:   Marland Kitchen Marital Status:   Intimate Partner Violence:   . Fear of Current or Ex-Partner:   . Emotionally Abused:   Marland Kitchen Physically Abused:   . Sexually Abused:     Outpatient Medications Prior to Visit  Medication Sig Dispense Refill  . acetaminophen (TYLENOL) 500 MG tablet Take 2,000 mg by mouth every 6 (six) hours as needed for pain. pain    . escitalopram (LEXAPRO) 20 MG tablet Take 1 tablet (20 mg total) by mouth daily. 90 tablet  1  . lisinopril-hydrochlorothiazide (ZESTORETIC) 10-12.5 MG tablet Take 1 tablet by mouth daily. 90 tablet 3  . REXULTI 2 MG TABS tablet Take 2 mg by mouth daily.    . rosuvastatin (CRESTOR) 5 MG tablet Take 1 each evening after dinner for cholesterol. 30 tablet 3  . VYVANSE 40 MG capsule Take 40 mg by mouth every morning.    . Multiple Vitamins-Minerals (MENS MULTI VITAMIN & MINERAL PO) Take by mouth. (Patient not taking: Reported on 12/20/2019)     No facility-administered medications prior to visit.    Allergies  Allergen Reactions  . Penicillins Anaphylaxis    ROS Review of Systems  Constitutional: Negative.   HENT: Negative.   Eyes: Negative.   Respiratory: Negative.   Cardiovascular: Negative.   Gastrointestinal: Negative.   Endocrine: Negative.   Genitourinary: Negative.   Musculoskeletal: Negative.   Skin: Negative.   Allergic/Immunologic: Negative.   Neurological: Negative.   Hematological: Negative.   Psychiatric/Behavioral: Negative.   All other systems reviewed and are negative.     Objective:    Physical Exam Vitals and nursing note reviewed.  Constitutional:      General: He is not in acute distress.    Appearance: Normal appearance. He is normal weight. He is not ill-appearing, toxic-appearing or diaphoretic.  Cardiovascular:     Rate and Rhythm: Normal rate and regular rhythm.     Pulses: Normal pulses.     Heart sounds: Normal heart sounds. No murmur heard.  No friction rub. No gallop.   Pulmonary:     Effort: Pulmonary effort is normal. No respiratory distress.     Breath sounds: Normal breath sounds. No stridor. No wheezing, rhonchi or rales.  Chest:     Chest wall: No tenderness.  Skin:    Capillary Refill: Capillary refill takes less than 2 seconds.  Neurological:     General: No focal deficit present.     Mental Status: He is alert and oriented to person, place, and time. Mental status is at baseline.  Psychiatric:        Mood and Affect:  Mood normal.        Behavior: Behavior normal.        Thought Content: Thought content normal.        Judgment: Judgment normal.     BP 125/86   Pulse 91   Temp 97.7 F (36.5 C) (Temporal)   Resp 18   Ht 6\' 2"  (1.88 m)  Wt 263 lb 3.2 oz (119.4 kg)   SpO2 98%   BMI 33.79 kg/m  Wt Readings from Last 3 Encounters:  12/20/19 263 lb 3.2 oz (119.4 kg)  12/01/19 278 lb (126.1 kg)  03/28/17 258 lb 12.8 oz (117.4 kg)     There are no preventive care reminders to display for this patient.  There are no preventive care reminders to display for this patient.  Lab Results  Component Value Date   TSH 3.130 12/01/2019   Lab Results  Component Value Date   WBC 7.6 12/01/2019   HGB 14.4 12/01/2019   HCT 46.7 12/01/2019   MCV 76 (L) 12/01/2019   PLT 290 12/01/2019   Lab Results  Component Value Date   NA 137 12/01/2019   K 4.2 12/01/2019   CO2 20 12/01/2019   GLUCOSE 75 12/01/2019   BUN 10 12/01/2019   CREATININE 1.02 12/01/2019   BILITOT 0.4 12/01/2019   ALKPHOS 97 12/01/2019   AST 19 12/01/2019   ALT 21 12/01/2019   PROT 8.1 12/01/2019   ALBUMIN 4.6 12/01/2019   CALCIUM 9.4 12/01/2019   Lab Results  Component Value Date   CHOL 236 (H) 12/01/2019   Lab Results  Component Value Date   HDL 54 12/01/2019   Lab Results  Component Value Date   LDLCALC 153 (H) 12/01/2019   Lab Results  Component Value Date   TRIG 159 (H) 12/01/2019   Lab Results  Component Value Date   CHOLHDL 4.4 12/01/2019   No results found for: HGBA1C    Assessment & Plan:   Problem List Items Addressed This Visit    None    Visit Diagnoses    Essential hypertension    -  Primary   Pure hypercholesterolemia       Encounter to establish care          No orders of the defined types were placed in this encounter.   Follow-up: No follow-ups on file.   PLAN  Continue medication as prescribed  Return in 6 mos for bp check and labs  Patient encouraged to call clinic with  any questions, comments, or concerns.  Maximiano Coss, NP

## 2019-12-21 DIAGNOSIS — F4310 Post-traumatic stress disorder, unspecified: Secondary | ICD-10-CM | POA: Diagnosis not present

## 2019-12-28 DIAGNOSIS — F4310 Post-traumatic stress disorder, unspecified: Secondary | ICD-10-CM | POA: Diagnosis not present

## 2020-01-02 DIAGNOSIS — F3181 Bipolar II disorder: Secondary | ICD-10-CM | POA: Diagnosis not present

## 2020-01-02 DIAGNOSIS — F902 Attention-deficit hyperactivity disorder, combined type: Secondary | ICD-10-CM | POA: Diagnosis not present

## 2020-01-02 DIAGNOSIS — F4312 Post-traumatic stress disorder, chronic: Secondary | ICD-10-CM | POA: Diagnosis not present

## 2020-01-02 DIAGNOSIS — F411 Generalized anxiety disorder: Secondary | ICD-10-CM | POA: Diagnosis not present

## 2020-01-04 DIAGNOSIS — F4310 Post-traumatic stress disorder, unspecified: Secondary | ICD-10-CM | POA: Diagnosis not present

## 2020-01-11 DIAGNOSIS — F4310 Post-traumatic stress disorder, unspecified: Secondary | ICD-10-CM | POA: Diagnosis not present

## 2020-01-18 DIAGNOSIS — F4310 Post-traumatic stress disorder, unspecified: Secondary | ICD-10-CM | POA: Diagnosis not present

## 2020-01-25 DIAGNOSIS — F4310 Post-traumatic stress disorder, unspecified: Secondary | ICD-10-CM | POA: Diagnosis not present

## 2020-02-01 DIAGNOSIS — F4310 Post-traumatic stress disorder, unspecified: Secondary | ICD-10-CM | POA: Diagnosis not present

## 2020-02-08 DIAGNOSIS — F4310 Post-traumatic stress disorder, unspecified: Secondary | ICD-10-CM | POA: Diagnosis not present

## 2020-02-15 DIAGNOSIS — F4310 Post-traumatic stress disorder, unspecified: Secondary | ICD-10-CM | POA: Diagnosis not present

## 2020-02-29 DIAGNOSIS — F4310 Post-traumatic stress disorder, unspecified: Secondary | ICD-10-CM | POA: Diagnosis not present

## 2020-03-06 ENCOUNTER — Other Ambulatory Visit: Payer: Self-pay | Admitting: Family Medicine

## 2020-03-06 DIAGNOSIS — E7800 Pure hypercholesterolemia, unspecified: Secondary | ICD-10-CM

## 2020-03-06 NOTE — Telephone Encounter (Signed)
Requested Prescriptions  Pending Prescriptions Disp Refills   rosuvastatin (CRESTOR) 5 MG tablet [Pharmacy Med Name: ROSUVASTATIN CALCIUM 5 MG TAB] 90 tablet 1    Sig: TAKE 1 TABLET BY MOUTH EACH EVENING AFTER DINNER FOR CHOLESTEROL.     Cardiovascular:  Antilipid - Statins Failed - 03/06/2020  2:02 AM      Failed - Total Cholesterol in normal range and within 360 days    Cholesterol, Total  Date Value Ref Range Status  12/01/2019 236 (H) 100 - 199 mg/dL Final         Failed - LDL in normal range and within 360 days    LDL Chol Calc (NIH)  Date Value Ref Range Status  12/01/2019 153 (H) 0 - 99 mg/dL Final         Failed - Triglycerides in normal range and within 360 days    Triglycerides  Date Value Ref Range Status  12/01/2019 159 (H) 0 - 149 mg/dL Final         Passed - HDL in normal range and within 360 days    HDL  Date Value Ref Range Status  12/01/2019 54 >39 mg/dL Final         Passed - Patient is not pregnant      Passed - Valid encounter within last 12 months    Recent Outpatient Visits          2 months ago Essential hypertension   [facility], Gerlene Burdock, NP   3 months ago Essential hypertension   [facility] at [facility], Sandria Bales, MD   2 years ago Depression, unspecified depression type   Primary Care at [facility], [place] D, PA-C   4 years ago Depression   [facility] at [facility], MD   5 years ago Depression   Primary Care at [place], Collie Siad, [place]      Future Appointments            In 3 months Janeece Agee, NP Primary [facility] at [place], [facility]

## 2020-03-07 DIAGNOSIS — F4310 Post-traumatic stress disorder, unspecified: Secondary | ICD-10-CM | POA: Diagnosis not present

## 2020-03-21 DIAGNOSIS — F4310 Post-traumatic stress disorder, unspecified: Secondary | ICD-10-CM | POA: Diagnosis not present

## 2020-04-04 DIAGNOSIS — F4310 Post-traumatic stress disorder, unspecified: Secondary | ICD-10-CM | POA: Diagnosis not present

## 2020-04-18 DIAGNOSIS — F4310 Post-traumatic stress disorder, unspecified: Secondary | ICD-10-CM | POA: Diagnosis not present

## 2020-05-02 DIAGNOSIS — F4310 Post-traumatic stress disorder, unspecified: Secondary | ICD-10-CM | POA: Diagnosis not present

## 2020-05-30 DIAGNOSIS — M19111 Post-traumatic osteoarthritis, right shoulder: Secondary | ICD-10-CM | POA: Diagnosis not present

## 2020-06-06 DIAGNOSIS — F4310 Post-traumatic stress disorder, unspecified: Secondary | ICD-10-CM | POA: Diagnosis not present

## 2020-06-20 ENCOUNTER — Encounter: Payer: Self-pay | Admitting: Registered Nurse

## 2020-06-20 ENCOUNTER — Other Ambulatory Visit: Payer: Self-pay

## 2020-06-20 ENCOUNTER — Ambulatory Visit (INDEPENDENT_AMBULATORY_CARE_PROVIDER_SITE_OTHER): Payer: BC Managed Care – PPO | Admitting: Registered Nurse

## 2020-06-20 VITALS — BP 137/94 | HR 78 | Temp 98.2°F | Resp 18 | Ht 74.0 in | Wt 266.4 lb

## 2020-06-20 DIAGNOSIS — I10 Essential (primary) hypertension: Secondary | ICD-10-CM | POA: Diagnosis not present

## 2020-06-20 DIAGNOSIS — E7800 Pure hypercholesterolemia, unspecified: Secondary | ICD-10-CM | POA: Diagnosis not present

## 2020-06-20 DIAGNOSIS — F4310 Post-traumatic stress disorder, unspecified: Secondary | ICD-10-CM | POA: Diagnosis not present

## 2020-06-20 DIAGNOSIS — F332 Major depressive disorder, recurrent severe without psychotic features: Secondary | ICD-10-CM | POA: Diagnosis not present

## 2020-06-20 MED ORDER — ESCITALOPRAM OXALATE 20 MG PO TABS
ORAL_TABLET | ORAL | 0 refills | Status: DC
Start: 2020-06-20 — End: 2020-06-20

## 2020-06-20 MED ORDER — LISINOPRIL-HYDROCHLOROTHIAZIDE 20-12.5 MG PO TABS: 1.0000 | ORAL_TABLET | Freq: Every day | ORAL | 3 refills | Status: DC

## 2020-06-20 MED ORDER — FLUOXETINE HCL 20 MG PO TABS: 20.0000 mg | ORAL_TABLET | Freq: Every day | ORAL | 0 refills | Status: DC

## 2020-06-20 NOTE — Progress Notes (Signed)
Established Patient Office Visit  Subjective:  Patient ID: Derek Mckinney, male    DOB: 1981/07/04  Age: 39 y.o. MRN: [id number]  CC:  Chief Complaint  Patient presents with  . Follow-up    Patient states he is here for an follow up. Per patient he would like to discuss medications.    HPI Derek Mckinney presents for follow up   Has been on escitalopram - getting through [facility]. Unfortunately they are requiring monthly drug tests. 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 Would prefer to have antidepressant managed through our office, wants to avoid all controlled substances.  Has been on a variety of antidepressants in the past with mixed effect. Knows that lexapro was okay, sertraline was not good. Daughter is currently on prozac and tolerates this well - thinks his mother is on this as well. Denies HI/SI Feeling good about recovery - does individual therapy once every other week and has good support at home.  Hypertension: Patient Currently taking: lisinopril-hctz 10-12.5mg  PO qd Good effect. No AEs. Denies CV symptoms including: chest pain, shob, doe, headache, visual changes, fatigue, claudication, and dependent edema.  Admits to poor compliance - has mild headaches when not taking meds. Previous readings and labs: BP Readings from Last 3 Encounters:  06/20/20 (!) 137/94  12/20/19 125/86  12/01/19 (!) 148/97   Lab Results  Component Value Date   CREATININE 1.02 12/01/2019    HLD: taking rosuvastatin 5mg  PO qd. Good effect. No AEs. Will check lipids today.  Past Medical History:  Diagnosis Date  . Anxiety   . Chronic insomnia   . Depression   . Gout   . Headache(784.0)   . Obesity   . Polysubstance abuse (HCC)   . Seizures (HCC)     Past Surgical History:  Procedure Laterality Date  . BANKART REPAIR  07/22/2011   Procedure:  OPEN Nira ConnBANKHARDT REPAIR;  Surgeon: Mable ParisJustin William Chandler, MD;  Location: [facility];  Service: Orthopedics;  Laterality: Right;  right shoulder hardware removal, bone grafting of humeral head defect, open bankhardt repair C ARM  . FRACTURE SURGERY    . HARDWARE REMOVAL  07/22/2011   Procedure: HARDWARE REMOVAL;  Surgeon: Mable ParisJustin William Chandler, MD;  Location: [facility];  Service: Orthopedics;  Laterality: Right;  . right shoulder surgery     2012  . widom teeth extraction      as teenager    Family History  Problem Relation Age of Onset  . Hypothyroidism Mother   . Depression Mother   . Physical abuse Mother   . Sexual abuse Mother   . Gout Father   . Lupus Father   . Hyperlipidemia Father   . Alcohol abuse Father   . Drug abuse Father   . Cancer Maternal Grandmother        lung cancer  . Physical abuse Brother   . Sexual abuse Brother     Social History   Socioeconomic History  . Marital status: Married    Spouse name: Not on file  . Number of children: Not on file  . Years of education: Not on file  . Highest education level: Not on file  Occupational History  . Not on file  Tobacco Use  . Smoking status: Never Smoker  . Smokeless tobacco: Current User    Types: Chew  Vaping Use  . Vaping Use: Never used  Substance and Sexual Activity  . Alcohol use: No  . Drug use: No  . Sexual activity: Yes    Partners: Female    Birth control/protection: None    Comment: married  Other Topics Concern  . Not on file  Social History Narrative  . Not on file   Social Determinants of Health   Financial Resource Strain: Not on file  Food Insecurity: Not on file  Transportation Needs: Not on file  Physical Activity: Not on file  Stress: Not on file  Social Connections: Not on file  Intimate Partner Violence: Not on file    Outpatient Medications Prior to Visit  Medication Sig Dispense Refill  . acetaminophen (TYLENOL) 500 MG tablet  Take 2,000 mg by mouth every 6 (six) hours as needed for pain. pain    . Multiple Vitamins-Minerals (MENS MULTI VITAMIN & MINERAL PO) Take by mouth.    Marland Kitchen REXULTI 2 MG TABS tablet Take 2 mg by mouth daily.    . rosuvastatin (CRESTOR) 5 MG tablet TAKE 1 TABLET BY MOUTH EACH EVENING AFTER DINNER FOR CHOLESTEROL. 90 tablet 2  . VYVANSE 40 MG capsule Take 40 mg by mouth every morning.    . escitalopram (LEXAPRO) 20 MG tablet Take 1 tablet (20 mg total) by mouth daily. 90 tablet 1  . lisinopril-hydrochlorothiazide (ZESTORETIC) 10-12.5 MG tablet Take 1 tablet by mouth daily. 90 tablet 3   No facility-administered medications prior to visit.    Allergies  Allergen Reactions  . Penicillins Anaphylaxis    ROS Review of Systems  Constitutional: Negative.   HENT: Negative.   Eyes: Negative.   Respiratory: Negative.   Cardiovascular: Negative.   Gastrointestinal: Negative.   Endocrine: Negative.   Genitourinary: Negative.   Musculoskeletal: Negative.   Skin: Negative.   Allergic/Immunologic: Negative.   Neurological: Negative.   Hematological: Negative.   Psychiatric/Behavioral: Negative.   All other systems reviewed and are negative.     Objective:    Physical Exam Constitutional:      General: He is not in acute distress.    Appearance: Normal appearance. He is normal weight. He is not ill-appearing, toxic-appearing or diaphoretic.  Cardiovascular:     Rate and Rhythm: Normal rate and regular rhythm.     Heart sounds: Normal heart sounds. No murmur heard. No friction rub. No gallop.   Pulmonary:     Effort: Pulmonary effort is normal. No respiratory distress.     Breath sounds: Normal breath sounds. No stridor. No wheezing, rhonchi or rales.  Chest:     Chest wall: No tenderness.  Neurological:     General: No focal deficit present.     Mental Status: He is alert and oriented to person, place, and time. Mental status is at baseline.  Psychiatric:        Mood and Affect:  Mood normal.        Behavior: Behavior normal.        Thought Content: Thought content normal.        Judgment: Judgment normal.     BP (!) 137/94   Pulse 78   Temp 98.2 F (36.8 C) (Temporal)   Resp 18   Ht 6\' 2"  (1.88 m)   Wt 266 lb 6.4 oz (120.8 kg)   SpO2 97%   BMI 34.20 kg/m  Wt Readings from Last 3 Encounters:  06/20/20 266 lb 6.4 oz (120.8 kg)  12/20/19 263 lb 3.2 oz (  119.4 kg)  12/01/19 278 lb (126.1 kg)     There are no preventive care reminders to display for this patient.  There are no preventive care reminders to display for this patient.  Lab Results  Component Value Date   TSH 3.130 12/01/2019   Lab Results  Component Value Date   WBC 7.6 12/01/2019   HGB 14.4 12/01/2019   HCT 46.7 12/01/2019   MCV 76 (L) 12/01/2019   PLT 290 12/01/2019   Lab Results  Component Value Date   NA 137 12/01/2019   K 4.2 12/01/2019   CO2 20 12/01/2019   GLUCOSE 75 12/01/2019   BUN 10 12/01/2019   CREATININE 1.02 12/01/2019   BILITOT 0.4 12/01/2019   ALKPHOS 97 12/01/2019   AST 19 12/01/2019   ALT 21 12/01/2019   PROT 8.1 12/01/2019   ALBUMIN 4.6 12/01/2019   CALCIUM 9.4 12/01/2019   Lab Results  Component Value Date   CHOL 236 (H) 12/01/2019   Lab Results  Component Value Date   HDL 54 12/01/2019   Lab Results  Component Value Date   LDLCALC 153 (H) 12/01/2019   Lab Results  Component Value Date   TRIG 159 (H) 12/01/2019   Lab Results  Component Value Date   CHOLHDL 4.4 12/01/2019   No results found for: HGBA1C    Assessment & Plan:   Problem List Items Addressed This Visit   None   Visit Diagnoses    Essential hypertension    -  Primary   Relevant Medications   lisinopril-hydrochlorothiazide (ZESTORETIC) 20-12.5 MG tablet   Other Relevant Orders   CBC   Comprehensive metabolic panel   Severe episode of recurrent major depressive disorder, without psychotic features (HCC)       Relevant Medications   escitalopram (LEXAPRO) 20 MG  tablet   Pure hypercholesterolemia       Relevant Medications   lisinopril-hydrochlorothiazide (ZESTORETIC) 20-12.5 MG tablet   Other Relevant Orders   Lipid Panel      Meds ordered this encounter  Medications  . DISCONTD: escitalopram (LEXAPRO) 20 MG tablet    Sig: Take 1/2 tablet (10mg  total) by mouth for 1-2 weeks. Then increase to 1 tablet (20mg  total) by mouth.    Dispense:  90 tablet    Refill:  0    Order Specific Question:   Supervising Provider    Answer:   , JEFFREY R [[id number]]  . lisinopril-hydrochlorothiazide (ZESTORETIC) 20-12.5 MG tablet    Sig: Take 1 tablet by mouth daily.    Dispense:  90 tablet    Refill:  3    Order Specific Question:   Supervising Provider    Answer:   , JEFFREY R [[id number]]  . FLUoxetine (PROZAC) 20 MG tablet    Sig: Take 1 tablet (20 mg total) by mouth daily. First 14 days: take 1/2 tablet (10mg ) by mouth daily.    Dispense:  90 tablet    Refill:  0    Please cancel escitalopram rx    Order Specific Question:   Supervising Provider    Answer:   Neva Seat, JEFFREY R [[id number]]    Follow-up: No follow-ups on file.   PLAN  Start fluoxetine  Avoid controlled substances  While we will not require routine UDS, encourage patient to follow up with [facility] for ongoing therapy and accountability to his recovery  Encouraged patient to return should he need further support or resources  Increase bp meds as above. Monitor home bp.  Encourage med compliance  Will check lipids before refilling rosuvastatin - may increase dose.  Patient encouraged to call clinic with any questions, comments, or concerns.  I spent 32 minutes with this patient, more than 50% of which was spent counseling and/or educating.  Janeece Agee, NP

## 2020-06-20 NOTE — Patient Instructions (Signed)
° ° ° °  If you have lab work done today you will be contacted with your lab results within the next 2 weeks.  If you have not heard from us then please contact us. The fastest way to get your results is to register for My Chart. ° ° °IF you received an x-ray today, you will receive an invoice from [facility]. Please contact [facility] at [phone] with questions or concerns regarding your invoice.  ° °IF you received labwork today, you will receive an invoice from [facility]. Please contact [facility] at [phone] with questions or concerns regarding your invoice.  ° °Our billing staff will not be able to assist you with questions regarding bills from these companies. ° °You will be contacted with the lab results as soon as they are available. The fastest way to get your results is to activate your My Chart account. Instructions are located on the last page of this paperwork. If you have not heard from us regarding the results in 2 weeks, please contact this office. °  ° ° ° °

## 2020-06-21 LAB — COMPREHENSIVE METABOLIC PANEL
ALT: 38 IU/L (ref 0–44)
AST: 20 IU/L (ref 0–40)
Albumin/Globulin Ratio: 1.5 (ref 1.2–2.2)
Albumin: 4.5 g/dL (ref 4.0–5.0)
Alkaline Phosphatase: 75 IU/L (ref 44–121)
BUN/Creatinine Ratio: 16 (ref 9–20)
BUN: 16 mg/dL (ref 6–20)
Bilirubin Total: 0.5 mg/dL (ref 0.0–1.2)
CO2: 19 mmol/L — ABNORMAL LOW (ref 20–29)
Calcium: 9 mg/dL (ref 8.7–10.2)
Chloride: 104 mmol/L (ref 96–106)
Creatinine, Ser: 1 mg/dL (ref 0.76–1.27)
GFR calc Af Amer: 109 mL/min/{1.73_m2} (ref >59)
GFR calc non Af Amer: 94 mL/min/{1.73_m2} (ref >59)
Globulin, Total: 3 g/dL (ref 1.5–4.5)
Glucose: 106 mg/dL — ABNORMAL HIGH (ref 65–99)
Potassium: 4.2 mmol/L (ref 3.5–5.2)
Sodium: 137 mmol/L (ref 134–144)
Total Protein: 7.5 g/dL (ref 6.0–8.5)

## 2020-06-21 LAB — CBC
Hematocrit: 46.5 % (ref 37.5–51.0)
Hemoglobin: 15.7 g/dL (ref 13.0–17.7)
MCH: 28.9 pg (ref 26.6–33.0)
MCHC: 33.8 g/dL (ref 31.5–35.7)
MCV: 86 fL (ref 79–97)
Platelets: 220 10*3/uL (ref 150–450)
RBC: 5.44 x10E6/uL (ref 4.14–5.80)
RDW: 14.9 % (ref 11.6–15.4)
WBC: 5.6 10*3/uL (ref 3.4–10.8)

## 2020-06-21 LAB — LIPID PANEL
Chol/HDL Ratio: 4.5 ratio (ref 0.0–5.0)
Cholesterol, Total: 246 mg/dL — ABNORMAL HIGH (ref 100–199)
HDL: 55 mg/dL (ref >39)
LDL Chol Calc (NIH): 159 mg/dL — ABNORMAL HIGH (ref 0–99)
Triglycerides: 176 mg/dL — ABNORMAL HIGH (ref 0–149)
VLDL Cholesterol Cal: 32 mg/dL (ref 5–40)

## 2020-06-22 ENCOUNTER — Other Ambulatory Visit: Payer: Self-pay | Admitting: Registered Nurse

## 2020-06-22 DIAGNOSIS — E7800 Pure hypercholesterolemia, unspecified: Secondary | ICD-10-CM

## 2020-06-22 MED ORDER — ROSUVASTATIN CALCIUM 10 MG PO TABS: 10.0000 mg | ORAL_TABLET | Freq: Every day | ORAL | 3 refills | Status: DC

## 2020-07-04 DIAGNOSIS — F4310 Post-traumatic stress disorder, unspecified: Secondary | ICD-10-CM | POA: Diagnosis not present

## 2020-07-18 DIAGNOSIS — F4310 Post-traumatic stress disorder, unspecified: Secondary | ICD-10-CM | POA: Diagnosis not present

## 2020-08-01 ENCOUNTER — Encounter: Payer: Self-pay | Admitting: Registered Nurse

## 2020-08-01 DIAGNOSIS — F4310 Post-traumatic stress disorder, unspecified: Secondary | ICD-10-CM | POA: Diagnosis not present

## 2020-08-15 DIAGNOSIS — F4310 Post-traumatic stress disorder, unspecified: Secondary | ICD-10-CM | POA: Diagnosis not present

## 2020-08-29 DIAGNOSIS — F4310 Post-traumatic stress disorder, unspecified: Secondary | ICD-10-CM | POA: Diagnosis not present

## 2020-09-11 ENCOUNTER — Other Ambulatory Visit: Payer: Self-pay | Admitting: Registered Nurse

## 2020-09-11 DIAGNOSIS — F332 Major depressive disorder, recurrent severe without psychotic features: Secondary | ICD-10-CM

## 2020-09-12 DIAGNOSIS — F4310 Post-traumatic stress disorder, unspecified: Secondary | ICD-10-CM | POA: Diagnosis not present

## 2020-09-26 DIAGNOSIS — F4310 Post-traumatic stress disorder, unspecified: Secondary | ICD-10-CM | POA: Diagnosis not present

## 2020-10-20 DIAGNOSIS — M19111 Post-traumatic osteoarthritis, right shoulder: Secondary | ICD-10-CM | POA: Diagnosis not present

## 2020-12-13 ENCOUNTER — Other Ambulatory Visit: Payer: Self-pay | Admitting: Registered Nurse

## 2020-12-13 DIAGNOSIS — F332 Major depressive disorder, recurrent severe without psychotic features: Secondary | ICD-10-CM

## 2020-12-19 ENCOUNTER — Ambulatory Visit: Payer: Self-pay | Admitting: Registered Nurse

## 2020-12-20 DIAGNOSIS — F4310 Post-traumatic stress disorder, unspecified: Secondary | ICD-10-CM | POA: Diagnosis not present

## 2020-12-25 ENCOUNTER — Ambulatory Visit: Payer: Self-pay | Admitting: Registered Nurse

## 2020-12-25 DIAGNOSIS — Z0289 Encounter for other administrative examinations: Secondary | ICD-10-CM

## 2021-01-17 DIAGNOSIS — F4310 Post-traumatic stress disorder, unspecified: Secondary | ICD-10-CM | POA: Diagnosis not present

## 2021-01-29 DIAGNOSIS — F4310 Post-traumatic stress disorder, unspecified: Secondary | ICD-10-CM | POA: Diagnosis not present

## 2021-02-13 DIAGNOSIS — F4310 Post-traumatic stress disorder, unspecified: Secondary | ICD-10-CM | POA: Diagnosis not present

## 2021-02-26 DIAGNOSIS — F4310 Post-traumatic stress disorder, unspecified: Secondary | ICD-10-CM | POA: Diagnosis not present

## 2021-03-21 ENCOUNTER — Other Ambulatory Visit: Payer: Self-pay | Admitting: Registered Nurse

## 2021-03-21 DIAGNOSIS — F332 Major depressive disorder, recurrent severe without psychotic features: Secondary | ICD-10-CM

## 2021-04-18 DIAGNOSIS — F4310 Post-traumatic stress disorder, unspecified: Secondary | ICD-10-CM | POA: Diagnosis not present

## 2021-05-01 DIAGNOSIS — F4310 Post-traumatic stress disorder, unspecified: Secondary | ICD-10-CM | POA: Diagnosis not present

## 2021-05-14 DIAGNOSIS — F4310 Post-traumatic stress disorder, unspecified: Secondary | ICD-10-CM | POA: Diagnosis not present

## 2021-05-15 DIAGNOSIS — F4310 Post-traumatic stress disorder, unspecified: Secondary | ICD-10-CM | POA: Diagnosis not present

## 2021-06-05 DIAGNOSIS — F4310 Post-traumatic stress disorder, unspecified: Secondary | ICD-10-CM | POA: Diagnosis not present

## 2021-06-19 DIAGNOSIS — F4310 Post-traumatic stress disorder, unspecified: Secondary | ICD-10-CM | POA: Diagnosis not present

## 2021-06-20 ENCOUNTER — Other Ambulatory Visit: Payer: Self-pay | Admitting: Registered Nurse

## 2021-06-20 DIAGNOSIS — I10 Essential (primary) hypertension: Secondary | ICD-10-CM

## 2021-06-20 NOTE — Telephone Encounter (Signed)
Courtesy refill patient needs an appointment.

## 2021-07-01 ENCOUNTER — Other Ambulatory Visit: Payer: Self-pay | Admitting: Registered Nurse

## 2021-07-01 DIAGNOSIS — F332 Major depressive disorder, recurrent severe without psychotic features: Secondary | ICD-10-CM

## 2021-07-01 NOTE — Telephone Encounter (Signed)
Patient is requesting a refill of the following medications: Requested Prescriptions   Pending Prescriptions Disp Refills   FLUoxetine (PROZAC) 20 MG tablet [Pharmacy Med Name: FLUOXETINE HCL 20 MG TABLET] 90 tablet 0    Sig: TAKE 1 TABLET BY MOUTH EVERY DAY    Date of patient request: 07/01/2021 Last office visit: 06/20/2020 Date of last refill: 03/21/2021 Last refill amount: 90 tablets Follow up time period per chart: none

## 2021-07-03 DIAGNOSIS — F4310 Post-traumatic stress disorder, unspecified: Secondary | ICD-10-CM | POA: Diagnosis not present

## 2021-07-17 DIAGNOSIS — F4310 Post-traumatic stress disorder, unspecified: Secondary | ICD-10-CM | POA: Diagnosis not present

## 2021-07-26 ENCOUNTER — Telehealth: Payer: BC Managed Care – PPO | Admitting: Physician Assistant

## 2021-07-26 DIAGNOSIS — J020 Streptococcal pharyngitis: Secondary | ICD-10-CM

## 2021-07-26 MED ORDER — LIDOCAINE VISCOUS HCL 2 % MT SOLN: OROMUCOSAL | 0 refills | Status: DC

## 2021-07-26 MED ORDER — DOXYCYCLINE HYCLATE 100 MG PO TABS: 100.0000 mg | ORAL_TABLET | Freq: Two times a day (BID) | ORAL | 0 refills | Status: DC

## 2021-07-26 NOTE — Progress Notes (Signed)
Virtual Visit Consent   Derek Mckinney, you are scheduled for a virtual visit with a [facility] provider today.     Just as with appointments in the office, your consent must be obtained to participate.  Your consent will be active for this visit and any virtual visit you may have with one of our providers in the next 365 days.     If you have a MyChart account, a copy of this consent can be sent to you electronically.  All virtual visits are billed to your insurance company just like a traditional visit in the office.    As this is a virtual visit, video technology does not allow for your provider to perform a traditional examination.  This may limit your provider's ability to fully assess your condition.  If your provider identifies any concerns that need to be evaluated in person or the need to arrange testing (such as labs, EKG, etc.), we will make arrangements to do so.     Although advances in technology are sophisticated, we cannot ensure that it will always work on either your end or our end.  If the connection with a video visit is poor, the visit may have to be switched to a telephone visit.  With either a video or telephone visit, we are not always able to ensure that we have a secure connection.     I need to obtain your verbal consent now.   Are you willing to proceed with your visit today?    Derek Mckinney has provided verbal consent on 07/26/2021 for a virtual visit (video or telephone).   [name], PA-C   Date: 07/26/2021 7:56 AM   Virtual Visit via Video Note   I, [name], connected with  Derek Mckinney  ([id number], 04/19/1981) on 07/26/21 at  7:45 AM EST by a video-enabled telemedicine application and verified that I am speaking with the correct person using two identifiers.  Location: Patient: Virtual Visit Location Patient: Home Provider: Virtual Visit Location Provider: Home Office   I discussed the limitations of evaluation and management  by telemedicine and the availability of in person appointments. The patient expressed understanding and agreed to proceed.    History of Present Illness: Derek Mckinney is a 41 y.o. who identifies as a male who was assigned male at birth, and is being seen today for sore throat.  HPI: Sore Throat  This is a new problem. The current episode started yesterday. The problem has been gradually worsening. There has been no fever. Associated symptoms include congestion, coughing (mild), ear pain (bilaterally) and headaches. Pertinent negatives include no drooling, ear discharge, hoarse voice, plugged ear sensation, neck pain, swollen glands, trouble swallowing or vomiting. Associated symptoms comments: Sore throat woke him up in the middle of the night. He has had no exposure to strep or mono. Treatments tried: clindamycin for dental procedure; has been on since 07/17/21. The treatment provided no relief.   Covid tested yesterday and was negative. Patient with history of strep and reports this is very similar to previous episodes.   Problems:  Patient Active Problem List   Diagnosis Date Noted   Depression, major, recurrent, moderate (HCC) 05/25/2017    Class: Chronic   Opiate addiction (HCC) 10/15/2012   Insomnia 08/05/2012   Pain in limb 08/05/2012   Chronic shoulder pain 05/18/2012   Seizures (HCC) 03/29/2012    Allergies:  Allergies  Allergen Reactions   Penicillins Anaphylaxis  Medications:  Current Outpatient Medications:    doxycycline (VIBRA-TABS) 100 MG tablet, Take 1 tablet (100 mg total) by mouth 2 (two) times daily., Disp: 20 tablet, Rfl: 0   lidocaine (XYLOCAINE) 2 % solution, 78mL swish and swallow every 4 hours as needed for sore throat, Disp: 100 mL, Rfl: 0   acetaminophen (TYLENOL) 500 MG tablet, Take 2,000 mg by mouth every 6 (six) hours as needed for pain. pain, Disp: , Rfl:    FLUoxetine (PROZAC) 20 MG tablet, TAKE 1 TABLET BY MOUTH EVERY DAY, Disp: 90 tablet, Rfl: 0    lisinopril-hydrochlorothiazide (ZESTORETIC) 20-12.5 MG tablet, TAKE 1 TABLET BY MOUTH EVERY DAY, Disp: 90 tablet, Rfl: 0   Multiple Vitamins-Minerals (MENS MULTI VITAMIN & MINERAL PO), Take by mouth., Disp: , Rfl:    REXULTI 2 MG TABS tablet, Take 2 mg by mouth daily., Disp: , Rfl:    rosuvastatin (CRESTOR) 10 MG tablet, Take 1 tablet (10 mg total) by mouth daily., Disp: 90 tablet, Rfl: 3   VYVANSE 40 MG capsule, Take 40 mg by mouth every morning., Disp: , Rfl:   Observations/Objective: Patient is well-developed, well-nourished in no acute distress.  Resting comfortably at home.  Head is normocephalic, atraumatic.  No labored breathing.  Speech is clear and coherent with logical content.  Patient is alert and oriented at baseline.    Assessment and Plan: 1. Strep throat - doxycycline (VIBRA-TABS) 100 MG tablet; Take 1 tablet (100 mg total) by mouth 2 (two) times daily.  Dispense: 20 tablet; Refill: 0 - lidocaine (XYLOCAINE) 2 % solution; 89mL swish and swallow every 4 hours as needed for sore throat  Dispense: 100 mL; Refill: 0  - Suspect strep, but did advise to take a second Covid test in 24-48 hours; If positive, stop antibiotic - Have prescribed Doxycycline and Viscous lidocaine - Continue clindamycin until completed - Salt water gargles and warm liquids - Tylenol and ibuprofen alternated as needed - Seek in person evaluation if symptoms worsen or fail to improve  Follow Up Instructions: I discussed the assessment and treatment plan with the patient. The patient was provided an opportunity to ask questions and all were answered. The patient agreed with the plan and demonstrated an understanding of the instructions.  A copy of instructions were sent to the patient via MyChart unless otherwise noted below.    The patient was advised to call back or seek an in-person evaluation if the symptoms worsen or if the condition fails to improve as anticipated.  Time:  I spent 12 minutes  with the patient via telehealth technology discussing the above problems/concerns.    [name], PA-C

## 2021-07-26 NOTE — Patient Instructions (Signed)
Derek Mckinney, thank you for joining Derek Loveless, PA-C for today's virtual visit.  While this provider is not your primary care provider (PCP), if your PCP is located in our provider database this encounter information will be shared with them immediately following your visit.  Consent: (Patient) Derek Mckinney provided verbal consent for this virtual visit at the beginning of the encounter.  Current Medications:  Current Outpatient Medications:    doxycycline (VIBRA-TABS) 100 MG tablet, Take 1 tablet (100 mg total) by mouth 2 (two) times daily., Disp: 20 tablet, Rfl: 0   lidocaine (XYLOCAINE) 2 % solution, 12mL swish and swallow every 4 hours as needed for sore throat, Disp: 100 mL, Rfl: 0   acetaminophen (TYLENOL) 500 MG tablet, Take 2,000 mg by mouth every 6 (six) hours as needed for pain. pain, Disp: , Rfl:    FLUoxetine (PROZAC) 20 MG tablet, TAKE 1 TABLET BY MOUTH EVERY DAY, Disp: 90 tablet, Rfl: 0   lisinopril-hydrochlorothiazide (ZESTORETIC) 20-12.5 MG tablet, TAKE 1 TABLET BY MOUTH EVERY DAY, Disp: 90 tablet, Rfl: 0   Multiple Vitamins-Minerals (MENS MULTI VITAMIN & MINERAL PO), Take by mouth., Disp: , Rfl:    REXULTI 2 MG TABS tablet, Take 2 mg by mouth daily., Disp: , Rfl:    rosuvastatin (CRESTOR) 10 MG tablet, Take 1 tablet (10 mg total) by mouth daily., Disp: 90 tablet, Rfl: 3   VYVANSE 40 MG capsule, Take 40 mg by mouth every morning., Disp: , Rfl:    Medications ordered in this encounter:  Meds ordered this encounter  Medications   doxycycline (VIBRA-TABS) 100 MG tablet    Sig: Take 1 tablet (100 mg total) by mouth 2 (two) times daily.    Dispense:  20 tablet    Refill:  0    Order Specific Question:   Supervising Provider    Answer:   MILLER, BRIAN [[id number]]   lidocaine (XYLOCAINE) 2 % solution    Sig: 70mL swish and swallow every 4 hours as needed for sore throat    Dispense:  100 mL    Refill:  0    Order Specific Question:   Supervising Provider    Answer:    Eber Hong [[id number]]     *If you need refills on other medications prior to your next appointment, please contact your pharmacy*  Follow-Up: Call back or seek an in-person evaluation if the symptoms worsen or if the condition fails to improve as anticipated.  Other Instructions Strep Throat, Adult Strep throat is an infection in the throat that is caused by bacteria. It is common during the cold months of the year. It mostly affects children who are 62-75 years old. However, people of all ages can get it at any time of the year. This infection spreads from person to person (is contagious) through coughing, sneezing, or having close contact. Your health care provider may use other names to describe the infection. When strep throat affects the tonsils, it is called tonsillitis. When it affects the back of the throat, it is called pharyngitis. What are the causes? This condition is caused by the Streptococcus pyogenes bacteria. What increases the risk? You are more likely to develop this condition if: You care for school-age children, or are around school-age children. Children are more likely to get strep throat and may spread it to others. You spend time in crowded places where the infection can spread easily. You have close contact with someone who has strep throat. What are  the signs or symptoms? Symptoms of this condition include: Fever or chills. Redness, swelling, or pain in the tonsils or throat. Pain or difficulty when swallowing. White or yellow spots on the tonsils or throat. Tender glands in the neck and under the jaw. Bad smelling breath. Red rash all over the body. This is rare. How is this diagnosed? This condition is diagnosed by tests that check for the presence and the amount of bacteria that cause strep throat. They are: Rapid strep test. Your throat is swabbed and checked for the presence of bacteria. Results are usually ready in minutes. Throat culture test. Your  throat is swabbed. The sample is placed in a cup that allows infections to grow. Results are usually ready in 1 or 2 days. How is this treated? This condition may be treated with: Medicines that kill germs (antibiotics). Medicines that relieve pain or fever. These include: Ibuprofen or acetaminophen. Aspirin, only for people who are over the age of 32. Throat lozenges. Throat sprays. Follow these instructions at home: Medicines  Take over-the-counter and prescription medicines only as told by your health care provider. Take your antibiotic medicine as told by your health care provider. Do not stop taking the antibiotic even if you start to feel better. Eating and drinking  If you have trouble swallowing, try eating soft foods until your sore throat feels better. Drink enough fluid to keep your urine pale yellow. To help relieve pain, you may have: Warm fluids, such as soup and tea. Cold fluids, such as frozen desserts or popsicles. General instructions Gargle with a salt-water mixture 3-4 times a day or as needed. To make a salt-water mixture, completely dissolve -1 tsp (3-6 g) of salt in 1 cup (237 mL) of warm water. Get plenty of rest. Stay home from work or school until you have been taking antibiotics for 24 hours. Do not use any products that contain nicotine or tobacco. These products include cigarettes, chewing tobacco, and vaping devices, such as e-cigarettes. If you need help quitting, ask your health care provider. It is up to you to get your test results. Ask your health care provider, or the department that is doing the test, when your results will be ready. Keep all follow-up visits. This is important. How is this prevented?  Do not share food, drinking cups, or personal items that could cause the infection to spread to other people. Wash your hands often with soap and water for at least 20 seconds. If soap and water are not available, use hand sanitizer. Make sure that  all people in your house wash their hands well. Have family members tested if they have a sore throat or fever. They may need an antibiotic if they have strep throat. Contact a health care provider if: You have swelling in your neck that keeps getting bigger. You develop a rash, cough, or earache. You cough up a thick mucus that is green, yellow-brown, or bloody. You have pain or discomfort that does not get better with medicine. Your symptoms seem to be getting worse. You have a fever. Get help right away if: You have new symptoms, such as vomiting, severe headache, stiff or painful neck, chest pain, or shortness of breath. You have severe throat pain, drooling, or changes in your voice. You have swelling of the neck, or the skin on the neck becomes red and tender. You have signs of dehydration, such as tiredness (fatigue), dry mouth, and decreased urination. You become increasingly sleepy, or you cannot  wake up completely. Your joints become red or painful. These symptoms may represent a serious problem that is an emergency. Do not wait to see if the symptoms will go away. Get medical help right away. Call your local emergency services (911 in the [place] Do not drive yourself to the hospital. Summary Strep throat is an infection in the throat that is caused by the Streptococcus pyogenes bacteria. This infection is spread from person to person (is contagious) through coughing, sneezing, or having close contact. Take your medicines, including antibiotics, as told by your health care provider. Do not stop taking the antibiotic even if you start to feel better. To prevent the spread of germs, wash your hands well with soap and water. Have others do the same. Do not share food, drinking cups, or personal items. Get help right away if you have new symptoms, such as vomiting, severe headache, stiff or painful neck, chest pain, or shortness of breath. This information is not intended to replace  advice given to you by your health care provider. Make sure you discuss any questions you have with your health care provider. Document Revised: 10/16/2020 Document Reviewed: 10/16/2020 Elsevier Patient Education  2022 [employer]    If you have been instructed to have an in-person evaluation today at a local Urgent Care facility, please use the link below. It will take you to a list of all of our available [facility], including address, phone number and hours of operation. Please do not delay care.  [facility]  If you or a family member do not have a primary care provider, use the link below to schedule a visit and establish care. When you choose a [facility] primary care physician or advanced practice provider, you gain a long-term partner in health. Find a Primary Care Provider  Learn more about [facility]'s in-office and virtual care options:  - Get Care Now

## 2021-08-14 DIAGNOSIS — F4310 Post-traumatic stress disorder, unspecified: Secondary | ICD-10-CM | POA: Diagnosis not present

## 2021-08-28 DIAGNOSIS — F4310 Post-traumatic stress disorder, unspecified: Secondary | ICD-10-CM | POA: Diagnosis not present

## 2021-09-11 DIAGNOSIS — F4310 Post-traumatic stress disorder, unspecified: Secondary | ICD-10-CM | POA: Diagnosis not present

## 2021-09-25 DIAGNOSIS — F4310 Post-traumatic stress disorder, unspecified: Secondary | ICD-10-CM | POA: Diagnosis not present

## 2021-10-09 DIAGNOSIS — F4310 Post-traumatic stress disorder, unspecified: Secondary | ICD-10-CM | POA: Diagnosis not present

## 2021-10-11 ENCOUNTER — Other Ambulatory Visit: Payer: Self-pay | Admitting: Registered Nurse

## 2021-10-11 DIAGNOSIS — I10 Essential (primary) hypertension: Secondary | ICD-10-CM

## 2021-10-12 ENCOUNTER — Other Ambulatory Visit: Payer: Self-pay | Admitting: Registered Nurse

## 2021-10-12 DIAGNOSIS — F332 Major depressive disorder, recurrent severe without psychotic features: Secondary | ICD-10-CM

## 2021-10-25 ENCOUNTER — Other Ambulatory Visit: Payer: Self-pay | Admitting: Orthopedic Surgery

## 2021-10-25 DIAGNOSIS — M25511 Pain in right shoulder: Secondary | ICD-10-CM

## 2021-10-25 DIAGNOSIS — M19211 Secondary osteoarthritis, right shoulder: Secondary | ICD-10-CM | POA: Diagnosis not present

## 2021-11-03 ENCOUNTER — Ambulatory Visit
Admission: RE | Admit: 2021-11-03 | Discharge: 2021-11-03 | Disposition: A | Discharge status: Home / Self Care | Payer: BC Managed Care – PPO | Source: Ambulatory Visit | Attending: Orthopedic Surgery | Admitting: Orthopedic Surgery

## 2021-11-03 DIAGNOSIS — M25511 Pain in right shoulder: Secondary | ICD-10-CM | POA: Diagnosis not present

## 2021-11-04 ENCOUNTER — Encounter: Payer: Self-pay | Admitting: Physician Assistant

## 2021-11-04 ENCOUNTER — Telehealth: Payer: BC Managed Care – PPO | Admitting: Physician Assistant

## 2021-11-04 DIAGNOSIS — F339 Major depressive disorder, recurrent, unspecified: Secondary | ICD-10-CM

## 2021-11-04 NOTE — Patient Instructions (Signed)
?  Francoise Schaumann Grim, thank you for joining Karrie Meres, PA-C for today's virtual visit.  While this provider is not your primary care provider (PCP), if your PCP is located in our provider database this encounter information will be shared with them immediately following your visit. ? ?Consent: ?(Patient) Derek Mckinney provided verbal consent for this virtual visit at the beginning of the encounter. ? ?Current Medications: ? ?Current Outpatient Medications:  ?  acetaminophen (TYLENOL) 500 MG tablet, Take 2,000 mg by mouth every 6 (six) hours as needed for pain. pain, Disp: , Rfl:  ?  doxycycline (VIBRA-TABS) 100 MG tablet, Take 1 tablet (100 mg total) by mouth 2 (two) times daily., Disp: 20 tablet, Rfl: 0 ?  FLUoxetine (PROZAC) 20 MG tablet, TAKE 1 TABLET BY MOUTH EVERY DAY, Disp: 90 tablet, Rfl: 0 ?  lidocaine (XYLOCAINE) 2 % solution, 97mL swish and swallow every 4 hours as needed for sore throat, Disp: 100 mL, Rfl: 0 ?  lisinopril-hydrochlorothiazide (ZESTORETIC) 20-12.5 MG tablet, TAKE 1 TABLET BY MOUTH EVERY DAY, Disp: 90 tablet, Rfl: 0 ?  Multiple Vitamins-Minerals (MENS MULTI VITAMIN & MINERAL PO), Take by mouth., Disp: , Rfl:  ?  REXULTI 2 MG TABS tablet, Take 2 mg by mouth daily., Disp: , Rfl:  ?  rosuvastatin (CRESTOR) 10 MG tablet, Take 1 tablet (10 mg total) by mouth daily., Disp: 90 tablet, Rfl: 3 ?  VYVANSE 40 MG capsule, Take 40 mg by mouth every morning., Disp: , Rfl:   ? ?Medications ordered in this encounter:  ?No orders of the defined types were placed in this encounter. ?  ? ?*If you need refills on other medications prior to your next appointment, please contact your pharmacy* ? ?Follow-Up: ?Call back or seek an in-person evaluation if the symptoms worsen or if the condition fails to improve as anticipated. ? ?Other Instructions ?Follow up with your regular doctor in 1 week for reassessment and seek care sooner if your symptoms worsen or fail to improve. ? ? ? ?If you have been instructed to  have an in-person evaluation today at a local Urgent Care facility, please use the link below. It will take you to a list of all of our available [facility], including address, phone number and hours of operation. Please do not delay care.  ?[facility] ? ?If you or a family member do not have a primary care provider, use the link below to schedule a visit and establish care. When you choose a [facility] primary care physician or advanced practice provider, you gain a long-term partner in health. ?Find a Primary Care Provider ? ?Learn more about [facility]'s in-office and virtual care options: ?[facility] - Get Care Now ? ?

## 2021-11-04 NOTE — Progress Notes (Signed)
Mr. Derek Mckinney, Derek Mckinney are scheduled for a virtual visit with your provider today.   ? ?Just as we do with appointments in the office, we must obtain your consent to participate.  Your consent will be active for this visit and any virtual visit you may have with one of our providers in the next 365 days.   ? ?If you have a MyChart account, I can also send a copy of this consent to you electronically.  All virtual visits are billed to your insurance company just like a traditional visit in the office.  As this is a virtual visit, video technology does not allow for your provider to perform a traditional examination.  This may limit your provider's ability to fully assess your condition.  If your provider identifies any concerns that need to be evaluated in person or the need to arrange testing such as labs, EKG, etc, we will make arrangements to do so.   ? ?Although advances in technology are sophisticated, we cannot ensure that it will always work on either your end or our end.  If the connection with a video visit is poor, we may have to switch to a telephone visit.  With either a video or telephone visit, we are not always able to ensure that we have a secure connection.   I need to obtain your verbal consent now.   Are you willing to proceed with your visit today?  ? ?Derek Mckinney has provided verbal consent on 11/04/2021 for a virtual visit (video or telephone). ? ? ?Derek Fleischhacker Manfred Shirts, PA-C ?11/04/2021  8:00 PM ? ? ?Date:  11/04/2021  ? ?ID:  Derek Mckinney, DOB 05-18-1981, MRN [id number] ? ?Patient Location: Home ?Provider Location: Home Office ? ? ?Participants: Patient and Provider for Visit and Wrap up ? ?Method of visit: Video  ?Location of Patient: Home ?Location of Provider: Home Office ?Consent was obtain for visit over the video. ?Services rendered by provider: Visit was performed via video ? ?A video enabled telemedicine application was used and I verified that I am speaking with the correct person using two  identifiers. ? ?PCP:  Derek Agee, NP  ? ?Chief Complaint:  depression ? ?History of Present Illness:   ? ?Derek Mckinney is a 41 y.o. male with history as stated below. ?Presents video telehealth for an acute care visit ? ?Pt states that he has a h/o depression. He reports that he has been on several antidepressants for the last several years. Pt states that he is having symptoms of depression again such as feeling very sad, overwhelmed and getting irritated more than normally. Pt has been on prozac 20 mg. He feels that this dosage is not working for him anymore.  ? ?He denies SI, HI, AVH. Pt denies drug or ETOH use.  ? ?Past Medical, Surgical, Social History, Allergies, and Medications have been Reviewed. ? ?Past Medical History:  ?Diagnosis Date  ? Anxiety   ? Chronic insomnia   ? Depression   ? Gout   ? Headache(784.0)   ? Obesity   ? Polysubstance abuse (HCC)   ? Seizures (HCC)   ? ? ?No outpatient medications have been marked as taking for the 11/04/21 encounter (Video Visit) with [facility] PROVIDER.  ?  ? ?Allergies:   Penicillins  ? ?ROS ?See HPI for history of present illness. ? ?Physical Exam ?Constitutional:   ?   Appearance: Normal appearance.  ?Neurological:  ?   Mental Status: He is alert.  ?Psychiatric:     ?  Attention and Perception: Attention normal.     ?   Mood and Affect: Mood normal.     ?   Speech: Speech normal.     ?   Behavior: Behavior normal.     ?   Thought Content: Thought content does not include homicidal or suicidal ideation. Thought content does not include homicidal or suicidal plan.     ?   Judgment: Judgment normal.  ? ?MDM: pt c/o depression despite compliance with SSRI. Discussed that he will need to f/u with pcp for further titration of his medications for depression. He denies SI/HI or AVH. ?          ? ?There are no diagnoses linked to this encounter. ? ? ?Time:   ?Today, I have spent 8 minutes with the patient with telehealth technology discussing the above  problems, reviewing the chart, previous notes, medications and orders.  ? ? ?Tests Ordered: ?No orders of the defined types were placed in this encounter. ? ? ?Medication Changes: ?No orders of the defined types were placed in this encounter. ? ? ? ?Disposition:  Follow up  ?Signed, ?Tanzania Basham Manfred Shirts, PA-C  ?11/04/2021 8:00 PM    ? ? ?

## 2021-11-06 DIAGNOSIS — M19111 Post-traumatic osteoarthritis, right shoulder: Secondary | ICD-10-CM | POA: Diagnosis not present

## 2021-11-07 ENCOUNTER — Other Ambulatory Visit: Payer: Self-pay | Admitting: Orthopedic Surgery

## 2021-11-11 ENCOUNTER — Encounter (HOSPITAL_COMMUNITY): Payer: Self-pay

## 2021-11-11 NOTE — Patient Instructions (Addendum)
DUE TO COVID-19 ONLY TWO VISITORS  (aged 41 and older)  IS ALLOWED TO COME WITH YOU AND STAY IN THE WAITING ROOM ONLY DURING PRE OP AND PROCEDURE.   ?**NO VISITORS ARE ALLOWED IN THE SHORT STAY AREA OR RECOVERY ROOM!!** ? ?You are not required to quarantine ?Hand Hygiene often ?Do NOT share personal items ?Notify your provider if you are in close contact with someone who has COVID or you develop fever 100.4 or greater, new onset of sneezing, cough, sore throat, shortness of breath or body aches. ? ?     ? Your procedure is scheduled on:  11-21-21 ? ? [facility] ? ?  Report to admitting at 5:15 AM ? ? Call this number if you have problems the morning of surgery [phone] ? ? Do not eat food :After Midnight. ? ? After Midnight you may have the following liquids until 4:30 AM DAY OF SURGERY ? ?Water ?Black Coffee (sugar ok, NO MILK/CREAM OR CREAMERS)  ?Tea (sugar ok, NO MILK/CREAM OR CREAMERS) regular and decaf                             ?Plain Jell-O (NO RED)                                           ?Fruit ices (not with fruit pulp, NO RED)                                     ?Popsicles (NO RED)                                                                  ?Juice: apple, WHITE grape, WHITE cranberry ?Sports drinks like Gatorade (NO RED) ?Clear broth(vegetable,chicken,beef) ? ?             ?  ?  ?The day of surgery:  ?Drink ONE (1) Pre-Surgery Clear Ensure at 4:30 AM the morning of surgery. Drink in one sitting. Do not sip.  ?This drink was given to you during your hospital  ?pre-op appointment visit. ?Nothing else to drink after completing the Pre-Surgery Clear Ensure  ?  ?       If you have questions, please contact your surgeon?s office. ? ? ?FOLLOW ANY ADDITIONAL PRE OP INSTRUCTIONS YOU RECEIVED FROM YOUR SURGEON'S OFFICE!!! ?  ?  ?Oral Hygiene is also important to reduce your risk of infection.                                    ?Remember - BRUSH YOUR TEETH THE MORNING OF  SURGERY WITH YOUR REGULAR TOOTHPASTE ? ? Do NOT smoke after Midnight ? ? Take these medicines the morning of surgery with A SIP OF WATER:  Fluoxetine.  Tylenol if needed ?                  ?  You may not have any metal on your body including jewelry, and body piercing ? ?           Do not wear  lotions, powders, cologne, or deodorant ? ?            Men may shave face and neck. ? ? Do not bring valuables to the hospital. [facility] IS NOT RESPONSIBLE   FOR VALUABLES. ? ? Contacts, dentures or bridgework may not be worn into surgery. ?  ?Patients discharged on the day of surgery will not be allowed to drive home.  Someone NEEDS to stay with you for the first 24 hours after anesthesia. ? ?Special Instructions: Bring a copy of your healthcare power of attorney and living will documents the day of surgery if you haven't scanned them before. ? ?Please read over the following fact sheets you were given: IF YOU HAVE QUESTIONS ABOUT YOUR PRE-OP INSTRUCTIONS PLEASE CALL [phone] ? ? ?[place]- Preparing for Total Shoulder Arthroplasty  ?  ?Before surgery, you can play an important role. Because skin is not sterile, your skin needs to be as free of germs as possible. You can reduce the number of germs on your skin by using the following products. ?Benzoyl Peroxide Gel ?Reduces the number of germs present on the skin ?Applied twice a day to shoulder area starting two days before surgery   ? ?================================================================== ? ?Please follow these instructions carefully: ? ?BENZOYL PEROXIDE 5% GEL ? ?Please do not use if you have an allergy to benzoyl peroxide.   If your skin becomes reddened/irritated stop using the benzoyl peroxide. ? ?Starting two days before surgery, apply as follows: ?Apply benzoyl peroxide in the morning and at night. Apply after taking a shower. If you are not taking a shower clean entire shoulder front, back, and side along with the armpit with a clean  wet washcloth. ? ?Place a quarter-sized dollop on your shoulder and rub in thoroughly, making sure to cover the front, back, and side of your shoulder, along with the armpit.  ? ?2 days before ____ AM   ____ PM              1 day before ____ AM   ____ PM ?                        ?Do this twice a day for two days.  (Last application is the night before surgery, AFTER using the CHG soap as described below). ? ?Do NOT apply benzoyl peroxide gel on the day of surgery.  ? ?[facility] - Preparing for Surgery ?Before surgery, you can play an important role.  Because skin is not sterile, your skin needs to be as free of germs as possible.  You can reduce the number of germs on your skin by washing with CHG (chlorahexidine gluconate) soap before surgery.  CHG is an antiseptic cleaner which kills germs and bonds with the skin to continue killing germs even after washing. ?Please DO NOT use if you have an allergy to CHG or antibacterial soaps.  If your skin becomes reddened/irritated stop using the CHG and inform your nurse when you arrive at Short Stay. ?Do not shave (including legs and underarms) for at least 48 hours prior to the first CHG shower.  You may shave your face/neck. ? ?Please follow these instructions carefully: ? 1.  Shower with CHG Soap the night before surgery and the  morning of surgery. ?  2.  If you choose to wash your hair, wash your hair first as usual with your normal  shampoo. ? 3.  After you shampoo, rinse your hair and body thoroughly to remove the shampoo.                            ? 4.  Use CHG as you would any other liquid soap.  You can apply chg directly to the skin and wash.  Gently with a scrungie or clean washcloth. ? 5.  Apply the CHG Soap to your body ONLY FROM THE NECK DOWN.   Do   not use on face/ open      ?                     Wound or open sores. Avoid contact with eyes, ears mouth and   genitals (private parts).  ?                     Engineering geologist,  Genitals (private parts) with your  normal soap. ?            6.  Wash thoroughly, paying special attention to the area where your    surgery  will be performed. ? 7.  Thoroughly rinse your body with warm water from the neck down. ? 8.  DO NOT shower/wash with your normal soap after using and rinsing off the CHG Soap. ?               9.  Pat yourself dry with a clean towel. ?           10.  Wear clean pajamas. ?           11.  Place clean sheets on your bed the night of your first shower and do not  sleep with pets. ?Day of Surgery : ?Do not apply any lotions/deodorants the morning of surgery.  Please wear clean clothes to the hospital/surgery center. ? ?FAILURE TO FOLLOW THESE INSTRUCTIONS MAY RESULT IN THE CANCELLATION OF YOUR SURGERY ? ?PATIENT SIGNATURE_________________________________ ? ?NURSE SIGNATURE__________________________________ ? ?________________________________________________________________________  ?  ? ?Incentive Spirometer ? ?An incentive spirometer is a tool that can help keep your lungs clear and active. This tool measures how well you are filling your lungs with each breath. Taking long deep breaths may help reverse or decrease the chance of developing breathing (pulmonary) problems (especially infection) following: ?A long period of time when you are unable to move or be active. ?BEFORE THE PROCEDURE  ?If the spirometer includes an indicator to show your best effort, your nurse or respiratory therapist will set it to a desired goal. ?If possible, sit up straight or lean slightly forward. Try not to slouch. ?Hold the incentive spirometer in an upright position. ?INSTRUCTIONS FOR USE  ?Sit on the edge of your bed if possible, or sit up as far as you can in bed or on a chair. ?Hold the incentive spirometer in an upright position. ?Breathe out normally. ?Place the mouthpiece in your mouth and seal your lips tightly around it. ?Breathe in slowly and as deeply as possible, raising the piston or the ball toward the top of the  column. ?Hold your breath for 3-5 seconds or for as long as possible. Allow the piston or ball to fall to the bottom of the column. ?Remove the mouthpiece from your mouth and breathe out normally. ?Rest for a  fe 

## 2021-11-13 ENCOUNTER — Encounter (HOSPITAL_COMMUNITY): Payer: Self-pay

## 2021-11-13 ENCOUNTER — Ambulatory Visit (HOSPITAL_COMMUNITY)
Admission: RE | Admit: 2021-11-13 | Discharge: 2021-11-13 | Disposition: A | Discharge status: Home / Self Care | Payer: BC Managed Care – PPO | Source: Ambulatory Visit | Attending: Orthopedic Surgery | Admitting: Orthopedic Surgery

## 2021-11-13 ENCOUNTER — Other Ambulatory Visit: Payer: Self-pay

## 2021-11-13 ENCOUNTER — Encounter (HOSPITAL_COMMUNITY)
Admission: RE | Admit: 2021-11-13 | Discharge: 2021-11-13 | Disposition: A | Discharge status: Home / Self Care | Payer: BC Managed Care – PPO | Source: Ambulatory Visit | Attending: Orthopedic Surgery | Admitting: Orthopedic Surgery

## 2021-11-13 VITALS — BP 121/92 | HR 60 | Temp 98.5°F | Resp 16 | Ht 74.0 in | Wt 251.0 lb

## 2021-11-13 DIAGNOSIS — I2510 Atherosclerotic heart disease of native coronary artery without angina pectoris: Secondary | ICD-10-CM | POA: Insufficient documentation

## 2021-11-13 DIAGNOSIS — Z01818 Encounter for other preprocedural examination: Secondary | ICD-10-CM

## 2021-11-13 DIAGNOSIS — F4310 Post-traumatic stress disorder, unspecified: Secondary | ICD-10-CM | POA: Diagnosis not present

## 2021-11-13 HISTORY — DX: Essential (primary) hypertension: I10

## 2021-11-13 HISTORY — DX: Gastro-esophageal reflux disease without esophagitis: K21.9

## 2021-11-13 LAB — BASIC METABOLIC PANEL
Anion gap: 5 (ref 5–15)
BUN: 9 mg/dL (ref 6–20)
CO2: 26 mmol/L (ref 22–32)
Calcium: 9.1 mg/dL (ref 8.9–10.3)
Chloride: 106 mmol/L (ref 98–111)
Creatinine, Ser: 0.84 mg/dL (ref 0.61–1.24)
GFR, Estimated: >60 mL/min (ref >60)
Glucose, Bld: 98 mg/dL (ref 70–99)
Potassium: 4.4 mmol/L (ref 3.5–5.1)
Sodium: 137 mmol/L (ref 135–145)

## 2021-11-13 LAB — CBC
HCT: 46.6 % (ref 39.0–52.0)
Hemoglobin: 16 g/dL (ref 13.0–17.0)
MCH: 31.4 pg (ref 26.0–34.0)
MCHC: 34.3 g/dL (ref 30.0–36.0)
MCV: 91.6 fL (ref 80.0–100.0)
Platelets: 214 10*3/uL (ref 150–400)
RBC: 5.09 MIL/uL (ref 4.22–5.81)
RDW: 13.2 % (ref 11.5–15.5)
WBC: 4.8 10*3/uL (ref 4.0–10.5)
nRBC: 0 % (ref 0.0–0.2)

## 2021-11-13 LAB — SURGICAL PCR SCREEN
MRSA, PCR: NEGATIVE
Staphylococcus aureus: POSITIVE — AB

## 2021-11-13 NOTE — Progress Notes (Addendum)
PCP - Janeece Agee, MD ?Cardiologist - no ? ?PPM/ICD -  ?Device Orders -  ?Rep Notified -  ? ?Chest x-ray -  ?EKG -  ?Stress Test -  ?ECHO -  ?Cardiac Cath -  ? ?Sleep Study -  ?CPAP -  ? ?Fasting Blood Sugar -  ?Checks Blood Sugar _____ times a day ? ?Blood Thinner Instructions: ?Aspirin Instructions: ? ?ERAS Protcol - ?PRE-SURGERY Ensure   ? ?  ?COVID vaccine -X2 pfizer ? ?Activity--Able to walk a flight of stairs without SOB ? ?Anesthesia review: HTN, Seizures ? ?Patient denies shortness of breath, fever, cough and chest pain at PAT appointment ? ? ?All instructions explained to the patient, with a verbal understanding of the material. Patient agrees to go over the instructions while at home for a better understanding. Patient also instructed to self quarantine after being tested for COVID-19. The opportunity to ask questions was provided. ?  ?
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
## 2021-11-15 ENCOUNTER — Encounter: Payer: Self-pay | Admitting: Nurse Practitioner

## 2021-11-15 ENCOUNTER — Ambulatory Visit (INDEPENDENT_AMBULATORY_CARE_PROVIDER_SITE_OTHER): Payer: BC Managed Care – PPO | Admitting: Nurse Practitioner

## 2021-11-15 VITALS — BP 120/85 | HR 89 | Temp 97.9°F | Resp 18 | Wt 249.4 lb

## 2021-11-15 DIAGNOSIS — F331 Major depressive disorder, recurrent, moderate: Secondary | ICD-10-CM

## 2021-11-15 DIAGNOSIS — I10 Essential (primary) hypertension: Secondary | ICD-10-CM

## 2021-11-15 DIAGNOSIS — F419 Anxiety disorder, unspecified: Secondary | ICD-10-CM | POA: Diagnosis not present

## 2021-11-15 DIAGNOSIS — R569 Unspecified convulsions: Secondary | ICD-10-CM | POA: Diagnosis not present

## 2021-11-15 DIAGNOSIS — F1121 Opioid dependence, in remission: Secondary | ICD-10-CM | POA: Diagnosis not present

## 2021-11-15 DIAGNOSIS — G8929 Other chronic pain: Secondary | ICD-10-CM

## 2021-11-15 DIAGNOSIS — M25511 Pain in right shoulder: Secondary | ICD-10-CM

## 2021-11-15 MED ORDER — SERTRALINE HCL 50 MG PO TABS: 50.0000 mg | ORAL_TABLET | Freq: Every day | ORAL | 2 refills | Status: DC

## 2021-11-15 NOTE — Assessment & Plan Note (Signed)
Chronic, not controlled.  He states that he has been taking fluoxetine 40 mg daily for the last 3 weeks, and before then he was taking fluoxetine 20 mg daily.  He states that this was helping his depression and anxiety at first, however it no longer seems to be helping.  He was taking other medications in the past for anxiety and depression, however he states that he was not taking them regularly so he does not know if they actually worked or not.  His PHQ-9 is a 14 and his GAD-7 is an 11 today.  He denies SI/HI.  We will stop the fluoxetine and start sertraline 50 mg daily.  Continue seeing therapist regularly.  Follow-up in 4 to 6 weeks. ?

## 2021-11-15 NOTE — Patient Instructions (Signed)
It was great to see you! ? ?Stop your fluoxetine and start sertraline 50mg  daily. Continue talking with your therapist.  ? ?Let's follow-up in 4-6 weeks, sooner if you have concerns. ? ?If a referral was placed today, you will be contacted for an appointment. Please note that routine referrals can sometimes take up to 3-4 weeks to process. Please call our office if you haven't heard anything after this time frame. ? ?Take care, ? ? , NP ? ?

## 2021-11-15 NOTE — Assessment & Plan Note (Signed)
Chronic, not controlled.  He states that he has been taking fluoxetine 40 mg daily for the last 3 weeks, and before then he was taking fluoxetine 20 mg daily.  He states that this was helping his depression and anxiety at first, however it no longer seems to be helping.  He was taking other medications in the past for anxiety and depression, however he states that he was not taking them regularly so he does not know if they actually worked or not.  His PHQ-9 is a 14 and his GAD-7 is an 83 today.  He denies SI/HI.  We will stop the fluoxetine and start sertraline 50 mg daily.  Continue seeing therapist regularly.  Follow-up in 4 to 6 weeks. ?

## 2021-11-15 NOTE — Assessment & Plan Note (Signed)
He has a history of prescription medication abuse including benzos, opiates, Ambien.  He states that he has been trying to get clean for 5 years, he has had 2 relapses since then, however he has been clean for the last 2 years.  Congratulated him on this.  He is currently still following with a therapist, encouraged him to attend the sessions regularly.  Follow-up with any concerns. ?

## 2021-11-15 NOTE — Assessment & Plan Note (Signed)
He states that he has not had a seizure since being clean.  Follow-up with concerns. ?

## 2021-11-15 NOTE — Assessment & Plan Note (Signed)
Chronic, stable. Currently blood pressure well controlled without medication. Last BMP and CBC reviewed. Will not re-fill blood pressure medication as he is well controlled off medication.  ?

## 2021-11-15 NOTE — Assessment & Plan Note (Signed)
Chronic, ongoing.  He is currently scheduled to have shoulder surgery on Thursday.  Continue collaboration recommendations by orthopedics. ?

## 2021-11-20 ENCOUNTER — Telehealth: Payer: Self-pay | Admitting: Nurse Practitioner

## 2021-11-20 ENCOUNTER — Encounter (HOSPITAL_COMMUNITY): Payer: Self-pay | Admitting: Orthopedic Surgery

## 2021-11-20 NOTE — Telephone Encounter (Signed)
Derek Mckinney from [facility] needs a clearance for surgery today. He did not tell them he had changed drs. [phone] ?

## 2021-11-20 NOTE — Anesthesia Preprocedure Evaluation (Addendum)
Anesthesia Evaluation  Patient identified by MRN, date of birth, ID band Patient awake    Reviewed: Allergy & Precautions, NPO status , Patient's Chart, lab work & pertinent test results  Airway Mallampati: I  TM Distance: >3 FB Neck ROM: Full    Dental  (+) Dental Advisory Given, Caps, Chipped, Missing   Pulmonary neg pulmonary ROS,    Pulmonary exam normal breath sounds clear to auscultation       Cardiovascular hypertension, Pt. on medications Normal cardiovascular exam Rhythm:Regular Rate:Normal     Neuro/Psych  Headaches, Seizures -, Well Controlled,  PSYCHIATRIC DISORDERS Anxiety Depression    GI/Hepatic GERD  Medicated and Controlled,(+)     substance abuse  ,   Endo/Other  Hyperlipidemia Obesity  Renal/GU   negative genitourinary   Musculoskeletal Chronic right shoulder pain   Abdominal (+) + obese,   Peds  Hematology negative hematology ROS (+)   Anesthesia Other Findings   Reproductive/Obstetrics                           Anesthesia Physical Anesthesia Plan  ASA: 2  Anesthesia Plan: General   Post-op Pain Management: Regional block*, Ketamine IV*, Toradol IV (intra-op)*, Tylenol PO (pre-op)*, Dilaudid IV and Precedex   Induction: Intravenous and Cricoid pressure planned  PONV Risk Score and Plan: 3 and Treatment may vary due to age or medical condition, Midazolam, Dexamethasone and Ondansetron  Airway Management Planned: Oral ETT  Additional Equipment: None  Intra-op Plan:   Post-operative Plan: Extubation in OR  Informed Consent: I have reviewed the patients History and Physical, chart, labs and discussed the procedure including the risks, benefits and alternatives for the proposed anesthesia with the patient or authorized representative who has indicated his/her understanding and acceptance.     Dental advisory given  Plan Discussed with: Anesthesiologist and  CRNA  Anesthesia Plan Comments:        Anesthesia Quick Evaluation

## 2021-11-20 NOTE — Telephone Encounter (Signed)
Called and spoke to Derek Mckinney from [facility]. PCP given verbal clearance for surgery. Awaiting surgical clearance form to be faxed and completed.  ?

## 2021-11-21 ENCOUNTER — Ambulatory Visit (HOSPITAL_COMMUNITY): Payer: BC Managed Care – PPO | Admitting: Anesthesiology

## 2021-11-21 ENCOUNTER — Ambulatory Visit (HOSPITAL_COMMUNITY): Payer: BC Managed Care – PPO

## 2021-11-21 ENCOUNTER — Other Ambulatory Visit: Payer: Self-pay

## 2021-11-21 ENCOUNTER — Ambulatory Visit (HOSPITAL_COMMUNITY)
Admission: RE | Admit: 2021-11-21 | Discharge: 2021-11-21 | Disposition: A | Discharge status: Home / Self Care | Payer: BC Managed Care – PPO | Source: Ambulatory Visit | Attending: Orthopedic Surgery | Admitting: Orthopedic Surgery

## 2021-11-21 ENCOUNTER — Encounter (HOSPITAL_COMMUNITY)
Admission: RE | Disposition: A | Discharge status: Home / Self Care | Payer: Self-pay | Source: Ambulatory Visit | Attending: Orthopedic Surgery

## 2021-11-21 DIAGNOSIS — Z6832 Body mass index (BMI) 32.0-32.9, adult: Secondary | ICD-10-CM | POA: Diagnosis not present

## 2021-11-21 DIAGNOSIS — M25511 Pain in right shoulder: Secondary | ICD-10-CM | POA: Diagnosis not present

## 2021-11-21 DIAGNOSIS — F419 Anxiety disorder, unspecified: Secondary | ICD-10-CM | POA: Diagnosis not present

## 2021-11-21 DIAGNOSIS — G8918 Other acute postprocedural pain: Secondary | ICD-10-CM | POA: Diagnosis not present

## 2021-11-21 DIAGNOSIS — E669 Obesity, unspecified: Secondary | ICD-10-CM | POA: Diagnosis not present

## 2021-11-21 DIAGNOSIS — Z96611 Presence of right artificial shoulder joint: Secondary | ICD-10-CM | POA: Diagnosis not present

## 2021-11-21 DIAGNOSIS — Z01818 Encounter for other preprocedural examination: Secondary | ICD-10-CM

## 2021-11-21 DIAGNOSIS — M19011 Primary osteoarthritis, right shoulder: Secondary | ICD-10-CM | POA: Diagnosis not present

## 2021-11-21 DIAGNOSIS — F32A Depression, unspecified: Secondary | ICD-10-CM | POA: Diagnosis not present

## 2021-11-21 DIAGNOSIS — M19111 Post-traumatic osteoarthritis, right shoulder: Secondary | ICD-10-CM | POA: Diagnosis not present

## 2021-11-21 DIAGNOSIS — M25711 Osteophyte, right shoulder: Secondary | ICD-10-CM | POA: Diagnosis not present

## 2021-11-21 DIAGNOSIS — I2510 Atherosclerotic heart disease of native coronary artery without angina pectoris: Secondary | ICD-10-CM

## 2021-11-21 HISTORY — PX: REVERSE SHOULDER ARTHROPLASTY: SHX5054

## 2021-11-21 LAB — TYPE AND SCREEN
ABO/RH(D): A POS
Antibody Screen: NEGATIVE

## 2021-11-21 LAB — ABO/RH: ABO/RH(D): A POS

## 2021-11-21 SURGERY — ARTHROPLASTY, SHOULDER, TOTAL, REVERSE
Anesthesia: General | Site: Shoulder | Laterality: Right

## 2021-11-21 MED ORDER — DEXAMETHASONE SODIUM PHOSPHATE 10 MG/ML IJ SOLN
INTRAMUSCULAR | Status: DC | PRN
  Administered 2021-11-21: 8 mg via INTRAVENOUS

## 2021-11-21 MED ORDER — SUGAMMADEX SODIUM 500 MG/5ML IV SOLN
INTRAVENOUS | Status: AC
  Filled 2021-11-21: qty 5

## 2021-11-21 MED ORDER — OXYCODONE HCL 5 MG/5ML PO SOLN: 5.0000 mg | Freq: Once | ORAL | Status: AC | PRN

## 2021-11-21 MED ORDER — METHOCARBAMOL 500 MG IVPB - SIMPLE MED
INTRAVENOUS | Status: AC
  Filled 2021-11-21: qty 50

## 2021-11-21 MED ORDER — DEXAMETHASONE SODIUM PHOSPHATE 10 MG/ML IJ SOLN
INTRAMUSCULAR | Status: AC
  Filled 2021-11-21: qty 1

## 2021-11-21 MED ORDER — HYDROMORPHONE HCL 1 MG/ML IJ SOLN
INTRAMUSCULAR | Status: AC
  Filled 2021-11-21: qty 1

## 2021-11-21 MED ORDER — SUGAMMADEX SODIUM 500 MG/5ML IV SOLN
INTRAVENOUS | Status: DC | PRN
  Administered 2021-11-21: 240 mg via INTRAVENOUS

## 2021-11-21 MED ORDER — SODIUM CHLORIDE 0.9 % IR SOLN
Status: DC | PRN
  Administered 2021-11-21: 1000 mL

## 2021-11-21 MED ORDER — METHOCARBAMOL 500 MG PO TABS
500.0000 mg | ORAL_TABLET | Freq: Three times a day (TID) | ORAL | 0 refills | Status: DC | PRN
Start: 2021-11-21 — End: 2021-12-20

## 2021-11-21 MED ORDER — HYDROMORPHONE HCL 1 MG/ML IJ SOLN: 0.5000 mg | INTRAMUSCULAR | Status: DC | PRN

## 2021-11-21 MED ORDER — OXYCODONE HCL 5 MG PO TABS: 5.0000 mg | ORAL_TABLET | Freq: Four times a day (QID) | ORAL | 0 refills | Status: DC | PRN

## 2021-11-21 MED ORDER — OXYCODONE HCL 5 MG PO TABS
ORAL_TABLET | ORAL | Status: AC
  Filled 2021-11-21: qty 1

## 2021-11-21 MED ORDER — BUPIVACAINE LIPOSOME 1.3 % IJ SUSP
INTRAMUSCULAR | Status: DC | PRN
  Administered 2021-11-21: 10 mL via PERINEURAL

## 2021-11-21 MED ORDER — BUPIVACAINE HCL (PF) 0.5 % IJ SOLN
INTRAMUSCULAR | Status: DC | PRN
  Administered 2021-11-21: 15 mL via PERINEURAL

## 2021-11-21 MED ORDER — VANCOMYCIN HCL 1500 MG/300ML IV SOLN
1500.0000 mg | INTRAVENOUS | Status: AC
  Administered 2021-11-21: 1500 mg via INTRAVENOUS
  Filled 2021-11-21: qty 300

## 2021-11-21 MED ORDER — ONDANSETRON HCL 4 MG/2ML IJ SOLN
INTRAMUSCULAR | Status: DC | PRN
  Administered 2021-11-21: 4 mg via INTRAVENOUS

## 2021-11-21 MED ORDER — ONDANSETRON HCL 4 MG/2ML IJ SOLN: 4.0000 mg | Freq: Once | INTRAMUSCULAR | Status: DC | PRN

## 2021-11-21 MED ORDER — LACTATED RINGERS IV SOLN: INTRAVENOUS | Status: DC

## 2021-11-21 MED ORDER — METHOCARBAMOL 500 MG PO TABS: 500.0000 mg | ORAL_TABLET | Freq: Four times a day (QID) | ORAL | Status: DC | PRN

## 2021-11-21 MED ORDER — PROPOFOL 10 MG/ML IV BOLUS
INTRAVENOUS | Status: AC
  Filled 2021-11-21: qty 20

## 2021-11-21 MED ORDER — LIDOCAINE 2% (20 MG/ML) 5 ML SYRINGE
INTRAMUSCULAR | Status: DC | PRN
  Administered 2021-11-21: 80 mg via INTRAVENOUS

## 2021-11-21 MED ORDER — PROPOFOL 10 MG/ML IV BOLUS
INTRAVENOUS | Status: DC | PRN
  Administered 2021-11-21: 200 mg via INTRAVENOUS

## 2021-11-21 MED ORDER — OXYCODONE HCL 5 MG PO TABS
5.0000 mg | ORAL_TABLET | Freq: Once | ORAL | Status: AC | PRN
  Administered 2021-11-21: 5 mg via ORAL

## 2021-11-21 MED ORDER — PHENYLEPHRINE 80 MCG/ML (10ML) SYRINGE FOR IV PUSH (FOR BLOOD PRESSURE SUPPORT)
PREFILLED_SYRINGE | INTRAVENOUS | Status: DC | PRN
  Administered 2021-11-21: 40 ug via INTRAVENOUS
  Administered 2021-11-21: 160 ug via INTRAVENOUS
  Administered 2021-11-21: 40 ug via INTRAVENOUS

## 2021-11-21 MED ORDER — CHLORHEXIDINE GLUCONATE 0.12 % MT SOLN
15.0000 mL | Freq: Once | OROMUCOSAL | Status: AC
  Administered 2021-11-21: 15 mL via OROMUCOSAL

## 2021-11-21 MED ORDER — FENTANYL CITRATE (PF) 100 MCG/2ML IJ SOLN
INTRAMUSCULAR | Status: AC
Start: 2021-11-21 — End: ?
  Filled 2021-11-21: qty 2

## 2021-11-21 MED ORDER — ONDANSETRON HCL 4 MG/2ML IJ SOLN
INTRAMUSCULAR | Status: AC
  Filled 2021-11-21: qty 2

## 2021-11-21 MED ORDER — DROPERIDOL 2.5 MG/ML IJ SOLN: 0.6250 mg | Freq: Once | INTRAMUSCULAR | Status: DC | PRN

## 2021-11-21 MED ORDER — FENTANYL CITRATE (PF) 100 MCG/2ML IJ SOLN
INTRAMUSCULAR | Status: DC | PRN
  Administered 2021-11-21 (×2): 50 ug via INTRAVENOUS

## 2021-11-21 MED ORDER — METHOCARBAMOL 500 MG IVPB - SIMPLE MED
500.0000 mg | Freq: Four times a day (QID) | INTRAVENOUS | Status: DC | PRN
  Administered 2021-11-21: 500 mg via INTRAVENOUS

## 2021-11-21 MED ORDER — TRANEXAMIC ACID-NACL 1000-0.7 MG/100ML-% IV SOLN
1000.0000 mg | INTRAVENOUS | Status: AC
  Administered 2021-11-21: 1000 mg via INTRAVENOUS
  Filled 2021-11-21: qty 100

## 2021-11-21 MED ORDER — ROCURONIUM BROMIDE 10 MG/ML (PF) SYRINGE
PREFILLED_SYRINGE | INTRAVENOUS | Status: DC | PRN
  Administered 2021-11-21: 80 mg via INTRAVENOUS

## 2021-11-21 MED ORDER — MIDAZOLAM HCL 2 MG/2ML IJ SOLN
INTRAMUSCULAR | Status: DC | PRN
  Administered 2021-11-21: 2 mg via INTRAVENOUS

## 2021-11-21 MED ORDER — 0.9 % SODIUM CHLORIDE (POUR BTL) OPTIME
TOPICAL | Status: DC | PRN
  Administered 2021-11-21: 1000 mL

## 2021-11-21 MED ORDER — ORAL CARE MOUTH RINSE: 15.0000 mL | Freq: Once | OROMUCOSAL | Status: AC

## 2021-11-21 MED ORDER — HYDROMORPHONE HCL 1 MG/ML IJ SOLN
0.2500 mg | INTRAMUSCULAR | Status: DC | PRN
  Administered 2021-11-21 (×2): 0.5 mg via INTRAVENOUS

## 2021-11-21 MED ORDER — MIDAZOLAM HCL 2 MG/2ML IJ SOLN
INTRAMUSCULAR | Status: AC
  Filled 2021-11-21: qty 2

## 2021-11-21 MED ORDER — ACETAMINOPHEN 500 MG PO TABS
1000.0000 mg | ORAL_TABLET | Freq: Once | ORAL | Status: AC
  Administered 2021-11-21: 1000 mg via ORAL
  Filled 2021-11-21: qty 2

## 2021-11-21 SURGICAL SUPPLY — 82 items
BAG COUNTER SPONGE SURGICOUNT (BAG) ×1 IMPLANT
BAG ZIPLOCK 12X15 (MISCELLANEOUS) ×2 IMPLANT
BASEPLATE P2 COATD GLND 6.5X30 (Shoulder) IMPLANT
BIT DRILL 1.6MX128 (BIT) IMPLANT
BIT DRILL 2.5 DIA 127 CALI (BIT) ×1 IMPLANT
BIT DRILL 4 DIA CALIBRATED (BIT) ×1 IMPLANT
BLADE SAW SAG 73X25 THK (BLADE) ×1
BLADE SAW SGTL 73X25 THK (BLADE) ×1 IMPLANT
BOOTIES KNEE HIGH SLOAN (MISCELLANEOUS) ×4 IMPLANT
COOLER ICEMAN CLASSIC (MISCELLANEOUS) ×1 IMPLANT
COVER BACK TABLE 60X90IN (DRAPES) ×2 IMPLANT
COVER SURGICAL LIGHT HANDLE (MISCELLANEOUS) ×2 IMPLANT
DRAPE INCISE IOBAN 66X45 STRL (DRAPES) ×2 IMPLANT
DRAPE ORTHO SPLIT 77X108 STRL (DRAPES) ×2
DRAPE POUCH INSTRU U-SHP 10X18 (DRAPES) ×2 IMPLANT
DRAPE SHEET LG 3/4 BI-LAMINATE (DRAPES) ×2 IMPLANT
DRAPE SURG 17X11 SM STRL (DRAPES) ×2 IMPLANT
DRAPE SURG ORHT 6 SPLT 77X108 (DRAPES) ×2 IMPLANT
DRAPE TOP 10253 STERILE (DRAPES) ×2 IMPLANT
DRAPE U-SHAPE 47X51 STRL (DRAPES) ×2 IMPLANT
DRSG AQUACEL AG ADV 3.5X 6 (GAUZE/BANDAGES/DRESSINGS) ×2 IMPLANT
DURAPREP 26ML APPLICATOR (WOUND CARE) ×4 IMPLANT
ELECT BLADE TIP CTD 4 INCH (ELECTRODE) ×2 IMPLANT
ELECT REM PT RETURN 15FT ADLT (MISCELLANEOUS) ×2 IMPLANT
FACESHIELD WRAPAROUND (MASK) ×2 IMPLANT
FACESHIELD WRAPAROUND OR TEAM (MASK) ×1 IMPLANT
FIBERTAPE CERCLAGE TLINK SUT (SUTURE) ×1 IMPLANT
GLOVE BIO SURGEON STRL SZ7.5 (GLOVE) ×2 IMPLANT
GLOVE BIOGEL PI IND STRL 6.5 (GLOVE) ×1 IMPLANT
GLOVE BIOGEL PI IND STRL 8 (GLOVE) ×1 IMPLANT
GLOVE BIOGEL PI INDICATOR 6.5 (GLOVE) ×1
GLOVE BIOGEL PI INDICATOR 8 (GLOVE) ×1
GLOVE SURG POLYISO LF SZ6.5 (GLOVE) ×2 IMPLANT
GOWN STRL REUS W/ TWL XL LVL3 (GOWN DISPOSABLE) ×2 IMPLANT
GOWN STRL REUS W/TWL XL LVL3 (GOWN DISPOSABLE) ×2
HANDPIECE INTERPULSE COAX TIP (DISPOSABLE) ×1
HOOD PEEL AWAY FLYTE STAYCOOL (MISCELLANEOUS) ×6 IMPLANT
INSERT EPOLY STND HUM SKT+4 32 (Shoulder) ×2 IMPLANT
INSERT EPOLYSTD HUM SKT+4 32 (Shoulder) IMPLANT
KIT BASIN OR (CUSTOM PROCEDURE TRAY) ×2 IMPLANT
KIT TURNOVER KIT A (KITS) IMPLANT
MANIFOLD NEPTUNE II (INSTRUMENTS) ×2 IMPLANT
NDL TROCAR POINT SZ 2 1/2 (NEEDLE) IMPLANT
NEEDLE TROCAR POINT SZ 2 1/2 (NEEDLE) IMPLANT
NS IRRIG 1000ML POUR BTL (IV SOLUTION) ×2 IMPLANT
P2 COATDE GLNOID BSEPLT 6.5X30 (Shoulder) ×2 IMPLANT
PACK SHOULDER (CUSTOM PROCEDURE TRAY) ×2 IMPLANT
PAD COLD SHLDR WRAP-ON (PAD) ×1 IMPLANT
PASSER CERCLAGE STRT MED DISP (ORTHOPEDIC DISPOSABLE SUPPLIES) ×1 IMPLANT
PROTECTOR NERVE ULNAR (MISCELLANEOUS) IMPLANT
RESTRAINT HEAD UNIVERSAL NS (MISCELLANEOUS) ×2 IMPLANT
RETRIEVER SUT HEWSON (MISCELLANEOUS) IMPLANT
SCREW BONE LOCKING RSP 5.0X30 (Screw) ×2 IMPLANT
SCREW BONE LOCKING RSP 5.0X34 (Screw) ×2 IMPLANT
SCREW BONE RSP LOCK 5X18 (Screw) IMPLANT
SCREW BONE RSP LOCK 5X22 (Screw) IMPLANT
SCREW BONE RSP LOCK 5X30 (Screw) IMPLANT
SCREW BONE RSP LOCK 5X34 (Screw) IMPLANT
SCREW BONE RSP LOCKING 18MM LG (Screw) ×2 IMPLANT
SCREW BONE RSP LOCKING 5.0X32 (Screw) ×2 IMPLANT
SCREW RETAIN W/HEAD 32MM (Shoulder) ×1 IMPLANT
SET HNDPC FAN SPRY TIP SCT (DISPOSABLE) ×1 IMPLANT
SLING ARM IMMOBILIZER LRG (SOFTGOODS) ×1 IMPLANT
SLING ARM IMMOBILIZER MED (SOFTGOODS) IMPLANT
STEM HUMERAL 12X48 STD SHORT (Shoulder) ×1 IMPLANT
STRIP CLOSURE SKIN 1/2X4 (GAUZE/BANDAGES/DRESSINGS) ×4 IMPLANT
SUCTION FRAZIER HANDLE 10FR (MISCELLANEOUS)
SUCTION TUBE FRAZIER 10FR DISP (MISCELLANEOUS) IMPLANT
SUPPORT WRAP ARM LG (MISCELLANEOUS) ×1 IMPLANT
SUT ETHIBOND 2 V 37 (SUTURE) IMPLANT
SUT FIBERWIRE #2 38 REV NDL BL (SUTURE)
SUT MNCRL AB 4-0 PS2 18 (SUTURE) ×2 IMPLANT
SUT VIC AB 2-0 CT1 27 (SUTURE) ×2
SUT VIC AB 2-0 CT1 TAPERPNT 27 (SUTURE) ×2 IMPLANT
SUTURE FIBERWR#2 38 REV NDL BL (SUTURE) IMPLANT
TAPE LABRALWHITE 1.5X36 (TAPE) IMPLANT
TAPE SUT LABRALTAP WHT/BLK (SUTURE) IMPLANT
TENSIONER FIBERTAPE CERCLAGE (DISPOSABLE) ×1 IMPLANT
TOWEL OR 17X26 10 PK STRL BLUE (TOWEL DISPOSABLE) ×2 IMPLANT
TOWEL OR NON WOVEN STRL DISP B (DISPOSABLE) ×2 IMPLANT
TUBE SUCTION HIGH CAP CLEAR NV (SUCTIONS) ×1 IMPLANT
WATER STERILE IRR 1000ML POUR (IV SOLUTION) ×3 IMPLANT

## 2021-11-21 NOTE — Anesthesia Procedure Notes (Signed)
Procedure Name: Intubation Date/Time: 11/21/2021 7:31 AM Performed by: Niel Hummer, CRNA Pre-anesthesia Checklist: Patient identified, Emergency Drugs available, Suction available and Patient being monitored Patient Re-evaluated:Patient Re-evaluated prior to induction Oxygen Delivery Method: Circle system utilized Preoxygenation: Pre-oxygenation with 100% oxygen Induction Type: IV induction Ventilation: Mask ventilation without difficulty and Oral airway inserted - appropriate to patient size Laryngoscope Size: Mac and 4 Grade View: Grade I Tube type: Oral Tube size: 7.5 mm Number of attempts: 1 Airway Equipment and Method: Stylet Placement Confirmation: ETT inserted through vocal cords under direct vision, positive ETCO2 and breath sounds checked- equal and bilateral Secured at: 23 cm Tube secured with: Tape Dental Injury: Teeth and Oropharynx as per pre-operative assessment

## 2021-11-21 NOTE — Anesthesia Postprocedure Evaluation (Signed)
Anesthesia Post Note  Patient: Derek Mckinney  Procedure(s) Performed: REVERSE SHOULDER ARTHROPLASTY WITH HARDWARE REMOVAL (Right: Shoulder)     Patient location during evaluation: PACU Anesthesia Type: General Level of consciousness: awake and alert and oriented Pain management: pain level controlled Vital Signs Assessment: post-procedure vital signs reviewed and stable Respiratory status: spontaneous breathing, nonlabored ventilation and respiratory function stable Cardiovascular status: blood pressure returned to baseline and stable Postop Assessment: no apparent nausea or vomiting Anesthetic complications: no   No notable events documented.  Last Vitals:  Vitals:   11/21/21 0945 11/21/21 1000  BP: (!) 147/98 (!) 141/95  Pulse: 65 64  Resp: 18 17  Temp:  36.6 C  SpO2: 95% 95%    Last Pain:  Vitals:   11/21/21 0945  TempSrc:   PainSc: 5                  Tomica Arseneault A.

## 2021-11-21 NOTE — Evaluation (Signed)
Occupational Therapy Evaluation Patient Details Name: Derek Mckinney MRN: [id number] DOB: [birth year] Today's Date: 11/21/2021   History of Present Illness Patietn s/p Procedure(s) Performed: REVERSE SHOULDER ARTHROPLASTY WITH HARDWARE REMOVAL (Right: Shoulder)   Clinical Impression   Mr. Derek Mckinney is a 41 year old man s/p shoulder replacement without functional use of right dominant upper extremity secondary to effects of surgery and interscalene block and shoulder precautions. Therapist provided education and instruction to patient and spouse in regards to exercises, precautions, positioning, donning upper extremity clothing and bathing while maintaining shoulder precautions, ice and edema management and donning/doffing sling. Patient and spouse verbalized understanding and demonstrated as needed. Patient needed assistance to donn shirt, underwear, pants, socks and shoes and provided with instruction on compensatory strategies to perform ADLs. Patient to follow up with MD for further therapy needs.        Recommendations for follow up therapy are one component of a multi-disciplinary discharge planning process, led by the attending physician.  Recommendations may be updated based on patient status, additional functional criteria and insurance authorization.   Follow Up Recommendations  Follow physician's recommendations for discharge plan and follow up therapies    Assistance Recommended at Discharge PRN  Patient can return home with the following A little help with bathing/dressing/bathroom    Functional Status Assessment  Patient has had a recent decline in their functional status and demonstrates the ability to make significant improvements in function in a reasonable and predictable amount of time.  Equipment Recommendations  None recommended by OT    Recommendations for Other Services       Precautions / Restrictions Precautions Precautions: Shoulder Type of Shoulder  Precautions: No AROm, No PROM Shoulder Interventions: Off for dressing/bathing/exercises;Shoulder sling/immobilizer Precaution Booklet Issued:  (handouts) Required Braces or Orthoses: Sling Restrictions Weight Bearing Restrictions: Yes RUE Weight Bearing: Non weight bearing      Mobility Bed Mobility Overal bed mobility: Independent                  Transfers Overall transfer level: Independent                        Balance Overall balance assessment: No apparent balance deficits (not formally assessed)                                         ADL either performed or assessed with clinical judgement   ADL                                               Vision Patient Visual Report: No change from baseline       Perception     Praxis      Pertinent Vitals/Pain Pain Assessment Pain Assessment: No/denies pain (secondary to block)     Hand Dominance     Extremity/Trunk Assessment Upper Extremity Assessment Upper Extremity Assessment: RUE deficits/detail RUE Deficits / Details: impaired motor and sensation secondary to block   Lower Extremity Assessment Lower Extremity Assessment: Overall WFL for tasks assessed   Cervical / Trunk Assessment Cervical / Trunk Assessment: Normal   Communication     Cognition Arousal/Alertness: Awake/alert Behavior During Therapy: WFL for tasks assessed/performed Overall Cognitive Status: Within Functional Limits for tasks  assessed                                       General Comments       Exercises     Shoulder Instructions Shoulder Instructions Donning/doffing shirt without moving shoulder: Independent Method for sponge bathing under operated UE: Independent Donning/doffing sling/immobilizer: Caregiver independent with task Correct positioning of sling/immobilizer: Independent ROM for elbow, wrist and digits of operated UE: Independent Sling wearing  schedule (on at all times/off for ADL's): Independent Proper positioning of operated UE when showering: Independent Dressing change: Independent Positioning of UE while sleeping: Independent    Home Living Family/patient expects to be discharged to:: Private residence Living Arrangements: Spouse/significant other                                      Prior Functioning/Environment                          OT Problem List: Decreased strength;Decreased range of motion;Impaired UE functional use;Pain      OT Treatment/Interventions:      OT Goals(Current goals can be found in the care plan section) Acute Rehab OT Goals OT Goal Formulation: All assessment and education complete, DC therapy  OT Frequency:      Co-evaluation              AM-PAC OT "6 Clicks" Daily Activity     Outcome Measure Help from another person eating meals?: A Little Help from another person taking care of personal grooming?: None Help from another person toileting, which includes using toliet, bedpan, or urinal?: None Help from another person bathing (including washing, rinsing, drying)?: A Little Help from another person to put on and taking off regular upper body clothing?: None Help from another person to put on and taking off regular lower body clothing?: None 6 Click Score: 22   End of Session Nurse Communication:  (OT education complete)  Activity Tolerance: Patient tolerated treatment well Patient left: in chair;with family/visitor present  OT Visit Diagnosis: Muscle weakness (generalized) (M62.81)                Time: 5852-7782 OT Time Calculation (min): 16 min Charges:  OT General Charges $OT Visit: 1 Visit OT Evaluation $OT Eval Low Complexity: 1 Low  Lexiana Spindel, OTR/L Acute Care Rehab Services  Office [phone] Pager: [phone]   Kelli Churn 11/21/2021, 1:05 PM

## 2021-11-21 NOTE — Anesthesia Procedure Notes (Signed)
Anesthesia Regional Block: Interscalene brachial plexus block   Pre-Anesthetic Checklist: , timeout performed,  Correct Patient, Correct Site, Correct Laterality,  Correct Procedure, Correct Position, site marked,  Risks and benefits discussed,  Surgical consent,  Pre-op evaluation,  At surgeon's request and post-op pain management  Laterality: Right  Prep: chloraprep       Needles:  Injection technique: Single-shot  Needle Type: Echogenic Stimulator Needle     Needle Length: 10cm  Needle Gauge: 21   Needle insertion depth: 7 cm   Additional Needles:   Procedures:,,,, ultrasound used (permanent image in chart),,    Narrative:  Start time: 11/21/2021 7:15 AM End time: 11/21/2021 7:20 AM Injection made incrementally with aspirations every 5 mL.  Performed by: Personally  Anesthesiologist: Josephine Igo, MD  Additional Notes: Timeout performed. Patient sedated. Relevant anatomy ID'd using [place]. Incremental 2-54ml injection of LA with frequent aspiration. Patient tolerated procedure well.    Right Interscalene Block

## 2021-11-21 NOTE — Transfer of Care (Signed)
Immediate Anesthesia Transfer of Care Note  Patient: Crue Otero Knittle  Procedure(s) Performed: REVERSE SHOULDER ARTHROPLASTY WITH HARDWARE REMOVAL (Right: Shoulder)  Patient Location: PACU  Anesthesia Type:General  Level of Consciousness: awake, alert  and oriented  Airway & Oxygen Therapy: Patient Spontanous Breathing and Patient connected to face mask oxygen  Post-op Assessment: Report given to RN and Post -op Vital signs reviewed and stable  Post vital signs: Reviewed and stable  Last Vitals:  Vitals Value Taken Time  BP 149/89   Temp    Pulse 73 11/21/21 0924  Resp    SpO2 97 % 11/21/21 0924  Vitals shown include unvalidated device data.  Last Pain:  Vitals:   11/21/21 0547  TempSrc:   PainSc: 6       Patients Stated Pain Goal: 5 (11/21/21 0547)  Complications: No notable events documented.

## 2021-11-21 NOTE — H&P (Addendum)
Derek Mckinney is an 41 y.o. male.   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 Failed extensive conservative treatment.  Severe rotator cuff disease with atrophy.  Past Medical History:  Diagnosis Date   Anxiety    Chronic insomnia    Depression    GERD (gastroesophageal reflux disease)    Gout    Headache(784.0)    Hyperlipidemia    Hypertension    borderline   Obesity    Polysubstance abuse (HCC)    Seizures (HCC)     Past Surgical History:  Procedure Laterality Date   BANKART REPAIR  07/22/2011   Procedure: OPEN [name] REPAIR;  Surgeon: [name], MD;  Location: [facility];  Service: Orthopedics;  Laterality: Right;  right shoulder hardware removal, bone grafting of humeral head defect, open bankhardt repair C ARM   FRACTURE SURGERY     HARDWARE REMOVAL  07/22/2011   Procedure: HARDWARE REMOVAL;  Surgeon: [name], MD;  Location:  SURGERY CENTER;  Service: Orthopedics;  Laterality: Right;   right shoulder surgery     2012   widom teeth extraction      as teenager   BMI: Estimated body mass index is 32.02 kg/m as calculated from the following:   Height as of 11/13/21: 6\' 2"  (1.88 m).   Weight as of 11/15/21: 113.1 kg.    Diabetes: Patient does not have a diagnosis of diabetes.     Smoking Status:   reports that he has never smoked. His smokeless tobacco use includes chew.   Family History  Problem Relation Age of Onset   Hypothyroidism Mother    Depression Mother    Physical abuse Mother    Sexual abuse Mother    Gout Father    Lupus Father    Hyperlipidemia Father    Alcohol abuse Father    Drug abuse Father    Cancer Maternal Grandmother        lung cancer   Physical abuse Brother    Sexual abuse Brother    Social History:  reports that he has never smoked. His smokeless tobacco use includes  chew. He reports current drug use. Drug: Marijuana. He reports that he does not drink alcohol.  Allergies:  Allergies  Allergen Reactions   Penicillins Anaphylaxis    Medications Prior to Admission  Medication Sig Dispense Refill   acetaminophen (TYLENOL) 500 MG tablet Take 1,000 mg by mouth every 6 (six) hours as needed for moderate pain. pain     sertraline (ZOLOFT) 50 MG tablet Take 1 tablet (50 mg total) by mouth daily. 30 tablet 2    Results for orders placed or performed during the hospital encounter of 11/21/21 (from the past 48 hour(s))  ABO/Rh     Status: None   Collection Time: 11/21/21  6:00 AM  Result Value Ref Range   ABO/RH(D)      A POS Performed at [facility], [address] [place], [place]    No results found.  Review of Systems  All other systems reviewed and are negative.  Blood pressure (!) 148/95, pulse 68, temperature 98.3 F (36.8 C), temperature source Oral, resp. rate 16, SpO2 99 %. Physical Exam Constitutional:      Appearance: He is well-developed.  HENT:     Head: Atraumatic.  Eyes:     Extraocular Movements: Extraocular movements intact.  Cardiovascular:     Pulses: Normal pulses.  Pulmonary:     Effort: Pulmonary effort is normal.  Skin:    General: Skin is warm and dry.  Neurological:     Mental Status: He is alert and oriented to person, place, and time.  Psychiatric:        Mood and Affect: Mood normal.     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 Failed extensive conservative treatment.  Severe rotator cuff disease with atrophy. Plan R reverse TSA Risks / benefits of surgery discussed Consent on chart  NPO for OR Preop antibiotics   Glennon Hamilton, MD 11/21/2021, 7:19 AM

## 2021-11-21 NOTE — Discharge Instructions (Addendum)

## 2021-11-21 NOTE — Op Note (Signed)
Procedure(s): REVERSE SHOULDER ARTHROPLASTY WITH HARDWARE REMOVAL Procedure Note  Derek Mckinney male 41 y.o. 11/21/2021  Procedure(s) and Anesthesia Type:    * REVERSE SHOULDER ARTHROPLASTY WITH HARDWARE REMOVAL - Choice   Indications:  41 y.o. male  With complicated right shoulder history with history of multiple dislocations and subsequent stabilization surgery with bone grafting of the posterior humeral head defect greater than 10 years ago.  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  Ultimately given the deformity from previous surgery, severe atrophy of the subscapularis and extreme thinning of the rotator cuff I felt that reverse total shoulder arthroplasty was the only option which would adequately give him pain relief and function, despite his relatively young age.  He understood the risks benefits alternatives to surgery understood risks in the revision setting are certainly a bit higher than in the primary setting.      Surgeon: Rhae Hammock   Assistants: Sheryle Hail PA-C Amber was present and scrubbed throughout the procedure and was essential in positioning, retraction, exposure, and closure)  Anesthesia: General endotracheal anesthesia with preoperative interscalene block given by the attending anesthesiologist      Procedure Detail  REVERSE SHOULDER ARTHROPLASTY WITH HARDWARE REMOVAL   Estimated Blood Loss:  200 mL         Drains: none  Blood Given: none          Specimens: none        Complications:  * No complications entered in OR log *         Disposition: PACU - hemodynamically stable.         Condition: stable      OPERATIVE FINDINGS:  A DJO Altivate pressfit reverse total shoulder arthroplasty was placed with a  size 12 stem, a 32 standard glenosphere, and a +4 semiconstrained-mm poly  insert. The base plate  fixation was excellent.  His glenoid was noted to be fairly anteverted and he had a bit of posterior impingement with external rotation.  During implantation of the humeral component he did develop a small crack in the medial calcar which was treated intraoperatively with FiberWire cerclage with good fixation and ultimately good stable fixation of the implant.  PROCEDURE: The patient was identified in the preoperative holding area  where I personally marked the operative site after verifying site, side,  and procedure with the patient. An interscalene block given by  the attending anesthesiologist in the holding area and the patient was taken back to the operating room where all extremities were  carefully padded in position after general anesthesia was induced. She  was placed in a beach-chair position and the operative upper extremity was  prepped and draped in a standard sterile fashion. An approximately 10-  cm incision was made from the tip of the coracoid process to the center  point of the humerus at the level of the axilla. Dissection was carried  down through subcutaneous tissues to the level of the cephalic vein  which was taken laterally with the deltoid. The pectoralis major was  retracted medially. The subdeltoid space was developed and the lateral  edge of the conjoined tendon was identified. The undersurface of  conjoined tendon was palpated and the musculocutaneous nerve was not in  the field. Retractor was placed underneath the conjoined and second  retractor was placed lateral into the  deltoid. The  biceps tendon was severely degenerated.  The subscapularis was significantly thinned and taken down as a peel with the underlying capsule.  The  joint was then gently externally rotated while the capsule was released  from the humeral neck around to just beyond the 6 o'clock position. At  this point, the joint was dislocated and the humeral head was  presented  into the wound. The excessive osteophyte formation was removed with a  large rongeur.  He was noted to have fairly significant deformity of the head.  The cutting guide was used to make the appropriate  head cut and the head was saved for potentially bone grafting.  The glenoid was exposed with the arm in an  abducted extended position.  There was noted that the glenoid was anteverted.  The anterior and posterior labrum were  completely excised and the capsule was released circumferentially to  allow for exposure of the glenoid for preparation. The 2.5 mm drill was  placed using the guide in 5-10 inferior angulation and the tap was then advanced in the same hole. Small and large reamers were then used. The tap was then removed and the Metaglene was then screwed in with excellent purchase.  The peripheral guide was then used to drilled measured and filled peripheral locking screws. The size 32 standard glenosphere was then impacted on the [facility] taper and the central screw was placed. The humerus was then again exposed and the diaphyseal reamers were used followed by the metaphyseal reamers. The final broach was left in place in the proximal trial was placed. The joint was reduced and with this implant it was felt that soft tissue tensioning was appropriate with with some posterior impingement with external rotation.  The excessive posterior humeral osteophyte was removed with some improvement in this. Therefore, final humeral stem was placed press-fit with bone grafting.  During implantation of the the final implant it was noted that a small crack developed in the calcar.  Therefore the [employer]th[employer] FiberWire cerclage kit was used 2 cerclage proximally.  Once the cerclage was in place and the fracture adequately stabilized the implant was again impacted lightly.  I did feel that adequate press-fit was achieved and that cement was not necessary..  And then the trial polyethylene inserts were tested again  and the above implant was felt to be the most appropriate for final insertion. The joint was reduced taken through full range of motion and felt to be stable. Soft tissue tension was appropriate.  The joint was then copiously irrigated with pulse  lavage and the wound was then closed. The subscapularis could not be repaired.  Skin was closed with 2-0 Vicryl in a deep dermal layer and 4-0  Monocryl for skin closure. Steri-Strips were applied. Sterile  dressings were then applied as well as a sling. The patient was allowed  to awaken from general anesthesia, transferred to stretcher, and taken  to recovery room in stable condition.   POSTOPERATIVE PLAN: The patient will be observed in the recovery room and if his pain is well controlled with the regional block and he is hemodynamically stable he can be discharged home today with family.  Given his intraoperative calcar fracture we will move slowly in his recovery and likely keep him in a sling for about 6 weeks postoperatively with hand wrist elbow motion only.

## 2021-11-22 ENCOUNTER — Encounter (HOSPITAL_COMMUNITY): Payer: Self-pay | Admitting: Orthopedic Surgery

## 2021-11-27 DIAGNOSIS — F4310 Post-traumatic stress disorder, unspecified: Secondary | ICD-10-CM | POA: Diagnosis not present

## 2021-12-03 DIAGNOSIS — F4310 Post-traumatic stress disorder, unspecified: Secondary | ICD-10-CM | POA: Diagnosis not present

## 2021-12-06 DIAGNOSIS — Z96611 Presence of right artificial shoulder joint: Secondary | ICD-10-CM | POA: Diagnosis not present

## 2021-12-06 DIAGNOSIS — Z471 Aftercare following joint replacement surgery: Secondary | ICD-10-CM | POA: Diagnosis not present

## 2021-12-07 ENCOUNTER — Other Ambulatory Visit: Payer: Self-pay | Admitting: Nurse Practitioner

## 2021-12-09 NOTE — Telephone Encounter (Signed)
Chart supports rx refill Last ov: 11/15/21 Last refill: 11/15/21

## 2021-12-11 DIAGNOSIS — F4310 Post-traumatic stress disorder, unspecified: Secondary | ICD-10-CM | POA: Diagnosis not present

## 2021-12-19 NOTE — Progress Notes (Signed)
Established Patient Office Visit  Subjective   Patient ID: Derek Mckinney, male    DOB: 09-Jan-1981  Age: 41 y.o. MRN: [id number]  Chief Complaint  Patient presents with   Follow-up    4 wk f/u depression    HPI  Derek Mckinney is here today to follow-up on anxiety and depression. Last visit he was started on sertraline 50mg  daily.  He states that his depression is doing a lot better, however he is feeling more on edge, having increased anxiety, and possible agitation.  He states that his crying spells have stopped.  He denies SI/HI.  He also recently had shoulder surgery on his right shoulder.  He states that this went well and he is not having a lot of pain.  He expects to start physical therapy next month.     12/20/2021   11:58 AM 11/15/2021   11:34 AM 06/20/2020    8:10 AM 12/20/2019    8:59 AM 12/20/2019    8:58 AM  Depression screen PHQ 2/9  Decreased Interest 1 2 0 1 0  Down, Depressed, Hopeless 1 3 0 1 0  PHQ - 2 Score 2 5 0 2 0  Altered sleeping 1 1  2    Tired, decreased energy 0 1  1   Change in appetite 2 2  0   Feeling bad or failure about yourself  2 2  2    Trouble concentrating 1 2  2    Moving slowly or fidgety/restless 0 1  0   Suicidal thoughts  0  0   PHQ-9 Score 8 14  9    Difficult doing work/chores Somewhat difficult Very difficult  Somewhat difficult       12/20/2021   11:58 AM 11/15/2021   11:35 AM 12/01/2019    3:55 PM  GAD 7 : Generalized Anxiety Score  Nervous, Anxious, on Edge 3 2 0  Control/stop worrying 3 2 0  Worry too much - different things 3 2 0  Trouble relaxing 2 2 0  Restless 1 0 0  Easily annoyed or irritable 3 1 0  Afraid - awful might happen 2 2 0  Total GAD 7 Score 17 11 0  Anxiety Difficulty Very difficult Very difficult Not difficult at all   Past Medical History:  Diagnosis Date   Anxiety    Chronic insomnia    Depression    GERD (gastroesophageal reflux disease)    Gout    Headache(784.0)    Hyperlipidemia    Hypertension     borderline   Obesity    Polysubstance abuse (HCC)    Seizures (HCC)    Past Surgical History:  Procedure Laterality Date   BANKART REPAIR  07/22/2011   Procedure: OPEN REPAIR;  Surgeon: 12/22/2021, MD;  Location: [facility];  Service: Orthopedics;  Laterality: Right;  right shoulder hardware removal, bone grafting of humeral head defect, open bankhardt repair C ARM   FRACTURE SURGERY     HARDWARE REMOVAL  07/22/2011   Procedure: HARDWARE REMOVAL;  Surgeon: 12/03/2019, MD;  Location: [facility];  Service: Orthopedics;  Laterality: Right;   REVERSE SHOULDER ARTHROPLASTY Right 11/21/2021   Procedure: REVERSE SHOULDER ARTHROPLASTY WITH HARDWARE REMOVAL;  Surgeon: [name], MD;  Location: [facility];  Service: Orthopedics;  Laterality: Right;   right shoulder surgery     2012   widom teeth extraction      as teenager   ROS See pertinent positives  and negatives per HPI.    Objective:     BP 140/88 (BP Location: Left Arm, Cuff Size: Large)   Pulse 91   Temp (!) 97.1 F (36.2 C) (Temporal)   Wt 249 lb 9.6 oz (113.2 kg)   SpO2 99%   BMI 32.05 kg/m    Physical Exam Vitals and nursing note reviewed.  Constitutional:      Appearance: Normal appearance.  HENT:     Head: Normocephalic.  Eyes:     Conjunctiva/sclera: Conjunctivae normal.  Cardiovascular:     Rate and Rhythm: Normal rate and regular rhythm.     Pulses: Normal pulses.     Heart sounds: Normal heart sounds.  Pulmonary:     Effort: Pulmonary effort is normal.     Breath sounds: Normal breath sounds.  Musculoskeletal:     Cervical back: Normal range of motion.  Skin:    General: Skin is warm.  Neurological:     General: No focal deficit present.     Mental Status: He is alert and oriented to person, place, and time.  Psychiatric:        Mood and Affect: Mood normal.        Behavior: Behavior normal.        Thought Content: Thought content  normal.        Judgment: Judgment normal.    The 10-year ASCVD risk score (Arnett DK, et al., 2019) is: 1.9%    Assessment & Plan:   Problem List Items Addressed This Visit       Other   Depression, major, recurrent, moderate (HCC) - Primary    Chronic, improved.  He states that the Zoloft has helped his depression significantly and he noticed less crying spells, however he has noticed some more anxiety, and irritability.  He denies SI/HI.  His PHQ-9 went from a 14 to an 8, however his GAD-7 did go from an 11 to a 17.  We will have him stop the Zoloft and start Lexapro 10 mg daily.  If he is still having trouble with anxiety, irritability, agitation may consider bipolar with depression.      Relevant Medications   escitalopram (LEXAPRO) 10 MG tablet   Anxiety    Chronic, worsened.  He was started on sertraline and states that his anxiety and irritability got worse.  His GAD-7 went from an 11 to a 17.  We will stop the Zoloft and started on Lexapro.  If he is still having these issues, consider possible bipolar with depression.  Follow-up in 4 weeks.      Relevant Medications   escitalopram (LEXAPRO) 10 MG tablet    Return in about 4 weeks (around 01/17/2022) for Anxiety, Depression, may be virtual.    Gerre Scull, NP

## 2021-12-20 ENCOUNTER — Ambulatory Visit (INDEPENDENT_AMBULATORY_CARE_PROVIDER_SITE_OTHER): Payer: BC Managed Care – PPO | Admitting: Nurse Practitioner

## 2021-12-20 ENCOUNTER — Encounter: Payer: Self-pay | Admitting: Nurse Practitioner

## 2021-12-20 VITALS — BP 140/88 | HR 91 | Temp 97.1°F | Wt 249.6 lb

## 2021-12-20 DIAGNOSIS — F419 Anxiety disorder, unspecified: Secondary | ICD-10-CM | POA: Diagnosis not present

## 2021-12-20 DIAGNOSIS — F331 Major depressive disorder, recurrent, moderate: Secondary | ICD-10-CM | POA: Diagnosis not present

## 2021-12-20 MED ORDER — ESCITALOPRAM OXALATE 10 MG PO TABS: 10.0000 mg | ORAL_TABLET | Freq: Every day | ORAL | 1 refills | Status: AC

## 2021-12-20 NOTE — Patient Instructions (Signed)
It was great to see you!  Stop the zoloft and start lexapro 1 capsule daily. This can be taken at night or during the day.   Let's follow-up in 4 weeks, sooner if you have concerns.  If a referral was placed today, you will be contacted for an appointment. Please note that routine referrals can sometimes take up to 3-4 weeks to process. Please call our office if you haven't heard anything after this time frame.  Take care,  Rodman Pickle, NP

## 2021-12-20 NOTE — Assessment & Plan Note (Signed)
Chronic, worsened.  He was started on sertraline and states that his anxiety and irritability got worse.  His GAD-7 went from an 11 to a 17.  We will stop the Zoloft and started on Lexapro.  If he is still having these issues, consider possible bipolar with depression.  Follow-up in 4 weeks.

## 2021-12-20 NOTE — Assessment & Plan Note (Signed)
Chronic, improved.  He states that the Zoloft has helped his depression significantly and he noticed less crying spells, however he has noticed some more anxiety, and irritability.  He denies SI/HI.  His PHQ-9 went from a 14 to an 8, however his GAD-7 did go from an 11 to a 17.  We will have him stop the Zoloft and start Lexapro 10 mg daily.  If he is still having trouble with anxiety, irritability, agitation may consider bipolar with depression.

## 2021-12-25 DIAGNOSIS — F4310 Post-traumatic stress disorder, unspecified: Secondary | ICD-10-CM | POA: Diagnosis not present

## 2022-01-11 ENCOUNTER — Other Ambulatory Visit: Payer: Self-pay | Admitting: Nurse Practitioner

## 2022-01-13 DIAGNOSIS — Z471 Aftercare following joint replacement surgery: Secondary | ICD-10-CM | POA: Diagnosis not present

## 2022-01-13 DIAGNOSIS — Z96611 Presence of right artificial shoulder joint: Secondary | ICD-10-CM | POA: Diagnosis not present

## 2022-01-13 NOTE — Telephone Encounter (Signed)
Refill will be discuss at pt next appointment 01/17/2022

## 2022-01-13 NOTE — Telephone Encounter (Signed)
Next ov 01/17/22. Refill will be discussed during ov

## 2022-01-17 ENCOUNTER — Telehealth: Payer: BC Managed Care – PPO | Admitting: Nurse Practitioner

## 2022-01-20 DIAGNOSIS — M25611 Stiffness of right shoulder, not elsewhere classified: Secondary | ICD-10-CM | POA: Diagnosis not present

## 2022-01-20 DIAGNOSIS — M25511 Pain in right shoulder: Secondary | ICD-10-CM | POA: Diagnosis not present

## 2022-01-20 DIAGNOSIS — Z9889 Other specified postprocedural states: Secondary | ICD-10-CM | POA: Diagnosis not present

## 2022-01-20 DIAGNOSIS — Z96611 Presence of right artificial shoulder joint: Secondary | ICD-10-CM | POA: Diagnosis not present

## 2022-01-21 NOTE — Progress Notes (Deleted)
[facility] [address] [place] [address] Dept: [phone] Dept Fax: [phone]  Virtual Video Visit  I connected with Francoise Schaumann Whiteaker on 01/21/22 at  9:00 AM EDT by a video enabled telemedicine application and verified that I am speaking with the correct person using two identifiers.  Location patient: Home Location provider: Clinic Persons participating in the virtual visit: Patient; Rodman Pickle, NP; Willaim Bane, CMA  I discussed the limitations of evaluation and management by telemedicine and the availability of in person appointments. The patient expressed understanding and agreed to proceed.  No chief complaint on file.   SUBJECTIVE:  HPI: Derek Mckinney is a 41 y.o. male who presents with follow-up on depression and anxiety. Last visit he was started on lexapro 10mg  daily. He did not tolerate the zoloft as it made him more irritable.   Patient Active Problem List   Diagnosis Date Noted   Anxiety 11/15/2021   Primary hypertension 11/15/2021   Depression, major, recurrent, moderate (HCC) 05/25/2017    Class: Chronic   Opiate addiction (HCC) 10/15/2012   Insomnia 08/05/2012   Chronic shoulder pain 05/18/2012   Seizures (HCC) 03/29/2012    Past Surgical History:  Procedure Laterality Date   BANKART REPAIR  07/22/2011   Procedure: OPEN 07/24/2011 REPAIR;  Surgeon: [name], MD;  Location: [facility];  Service: Orthopedics;  Laterality: Right;  right shoulder hardware removal, bone grafting of humeral head defect, open bankhardt repair C ARM   FRACTURE SURGERY     HARDWARE REMOVAL  07/22/2011   Procedure: HARDWARE REMOVAL;  Surgeon: 07/24/2011, MD;  Location: [facility];  Service: Orthopedics;  Laterality: Right;   REVERSE SHOULDER ARTHROPLASTY Right 11/21/2021   Procedure: REVERSE SHOULDER ARTHROPLASTY WITH HARDWARE REMOVAL;  Surgeon: 11/23/2021, MD;  Location: [facility];  Service: Orthopedics;  Laterality: Right;   right shoulder surgery     2012   widom teeth extraction      as teenager    Family History  Problem Relation Age of Onset   Hypothyroidism Mother    Depression Mother    Physical abuse Mother    Sexual abuse Mother    Gout Father    Lupus Father    Hyperlipidemia Father    Alcohol abuse Father    Drug abuse Father    Cancer Maternal Grandmother        lung cancer   Physical abuse Brother    Sexual abuse Brother     Social History   Tobacco Use   Smoking status: Never   Smokeless tobacco: Current    Types: Chew   Tobacco comments:    Pouch with no tobacco   Vaping Use   Vaping Use: Never used  Substance Use Topics   Alcohol use: No   Drug use: Yes    Types: Marijuana     Current Outpatient Medications:    acetaminophen (TYLENOL) 500 MG tablet, Take 1,000 mg by mouth every 6 (six) hours as needed for moderate pain. pain, Disp: , Rfl:    escitalopram (LEXAPRO) 10 MG tablet, Take 1 tablet (10 mg total) by mouth daily., Disp: 30 tablet, Rfl: 1  Allergies  Allergen Reactions   Penicillins Anaphylaxis    ROS: See pertinent positives and negatives per HPI.  OBSERVATIONS/OBJECTIVE:  VITALS per patient if applicable: There were no vitals filed for this visit. There is no height or weight on file to calculate  BMI.    GENERAL: Alert and oriented. Appears well and in no acute distress.  HEENT: Atraumatic. Conjunctiva clear. No obvious abnormalities on inspection of external nose and ears.  NECK: Normal movements of the head and neck.  LUNGS: On inspection, no signs of respiratory distress. Breathing rate appears normal. No obvious gross SOB, gasping or wheezing, and no conversational dyspnea.  CV: No obvious cyanosis.  MS: Moves all visible extremities without noticeable abnormality.  PSYCH/NEURO: Pleasant and cooperative. No obvious depression or anxiety. Speech and thought  processing grossly intact.  ASSESSMENT AND PLAN:  Problem List Items Addressed This Visit   None    I discussed the assessment and treatment plan with the patient. The patient was provided an opportunity to ask questions and all were answered. The patient agreed with the plan and demonstrated an understanding of the instructions.   The patient was advised to call back or seek an in-person evaluation if the symptoms worsen or if the condition fails to improve as anticipated.   Gerre Scull, NP

## 2022-01-22 ENCOUNTER — Telehealth: Payer: BC Managed Care – PPO | Admitting: Nurse Practitioner

## 2022-01-22 ENCOUNTER — Telehealth: Payer: Self-pay | Admitting: Nurse Practitioner

## 2022-01-22 DIAGNOSIS — F4310 Post-traumatic stress disorder, unspecified: Secondary | ICD-10-CM | POA: Diagnosis not present

## 2022-01-22 NOTE — Telephone Encounter (Signed)
No show Letter sent 7.19.23

## 2022-02-04 DIAGNOSIS — F4310 Post-traumatic stress disorder, unspecified: Secondary | ICD-10-CM | POA: Diagnosis not present

## 2022-02-05 DIAGNOSIS — F4310 Post-traumatic stress disorder, unspecified: Secondary | ICD-10-CM | POA: Diagnosis not present

## 2022-02-07 NOTE — Telephone Encounter (Signed)
1st no show, fee waived, letter sent 

## 2022-02-15 ENCOUNTER — Telehealth: Payer: BC Managed Care – PPO | Admitting: Nurse Practitioner

## 2022-02-19 DIAGNOSIS — F4310 Post-traumatic stress disorder, unspecified: Secondary | ICD-10-CM | POA: Diagnosis not present

## 2022-02-26 DIAGNOSIS — F4310 Post-traumatic stress disorder, unspecified: Secondary | ICD-10-CM | POA: Diagnosis not present

## 2022-03-05 DIAGNOSIS — F4310 Post-traumatic stress disorder, unspecified: Secondary | ICD-10-CM | POA: Diagnosis not present

## 2022-03-12 DIAGNOSIS — F4310 Post-traumatic stress disorder, unspecified: Secondary | ICD-10-CM | POA: Diagnosis not present

## 2022-03-18 DIAGNOSIS — F4310 Post-traumatic stress disorder, unspecified: Secondary | ICD-10-CM | POA: Diagnosis not present

## 2022-03-19 DIAGNOSIS — F4310 Post-traumatic stress disorder, unspecified: Secondary | ICD-10-CM | POA: Diagnosis not present

## 2022-03-25 DIAGNOSIS — F4310 Post-traumatic stress disorder, unspecified: Secondary | ICD-10-CM | POA: Diagnosis not present

## 2022-04-09 DIAGNOSIS — F4310 Post-traumatic stress disorder, unspecified: Secondary | ICD-10-CM | POA: Diagnosis not present

## 2022-04-23 DIAGNOSIS — F4310 Post-traumatic stress disorder, unspecified: Secondary | ICD-10-CM | POA: Diagnosis not present

## 2022-05-01 DIAGNOSIS — F4310 Post-traumatic stress disorder, unspecified: Secondary | ICD-10-CM | POA: Diagnosis not present

## 2022-05-07 DIAGNOSIS — F4310 Post-traumatic stress disorder, unspecified: Secondary | ICD-10-CM | POA: Diagnosis not present

## 2022-05-21 DIAGNOSIS — F4310 Post-traumatic stress disorder, unspecified: Secondary | ICD-10-CM | POA: Diagnosis not present

## 2022-05-26 DIAGNOSIS — F4310 Post-traumatic stress disorder, unspecified: Secondary | ICD-10-CM | POA: Diagnosis not present

## 2022-06-04 DIAGNOSIS — F4310 Post-traumatic stress disorder, unspecified: Secondary | ICD-10-CM | POA: Diagnosis not present

## 2022-06-18 DIAGNOSIS — F4310 Post-traumatic stress disorder, unspecified: Secondary | ICD-10-CM | POA: Diagnosis not present

## 2022-07-16 DIAGNOSIS — F4310 Post-traumatic stress disorder, unspecified: Secondary | ICD-10-CM | POA: Diagnosis not present

## 2022-07-30 DIAGNOSIS — F4310 Post-traumatic stress disorder, unspecified: Secondary | ICD-10-CM | POA: Diagnosis not present

## 2022-08-13 DIAGNOSIS — F4310 Post-traumatic stress disorder, unspecified: Secondary | ICD-10-CM | POA: Diagnosis not present

## 2022-08-27 DIAGNOSIS — F4310 Post-traumatic stress disorder, unspecified: Secondary | ICD-10-CM | POA: Diagnosis not present

## 2022-09-09 DIAGNOSIS — F4310 Post-traumatic stress disorder, unspecified: Secondary | ICD-10-CM | POA: Diagnosis not present

## 2022-09-25 DIAGNOSIS — F4310 Post-traumatic stress disorder, unspecified: Secondary | ICD-10-CM | POA: Diagnosis not present

## 2022-10-08 DIAGNOSIS — F4310 Post-traumatic stress disorder, unspecified: Secondary | ICD-10-CM | POA: Diagnosis not present

## 2022-10-29 DIAGNOSIS — F4310 Post-traumatic stress disorder, unspecified: Secondary | ICD-10-CM | POA: Diagnosis not present

## 2022-11-05 DIAGNOSIS — F4310 Post-traumatic stress disorder, unspecified: Secondary | ICD-10-CM | POA: Diagnosis not present

## 2022-11-19 DIAGNOSIS — F4310 Post-traumatic stress disorder, unspecified: Secondary | ICD-10-CM | POA: Diagnosis not present

## 2022-12-03 DIAGNOSIS — F4310 Post-traumatic stress disorder, unspecified: Secondary | ICD-10-CM | POA: Diagnosis not present

## 2022-12-17 DIAGNOSIS — F4310 Post-traumatic stress disorder, unspecified: Secondary | ICD-10-CM | POA: Diagnosis not present

## 2022-12-31 DIAGNOSIS — F4310 Post-traumatic stress disorder, unspecified: Secondary | ICD-10-CM | POA: Diagnosis not present

## 2023-01-14 DIAGNOSIS — F4310 Post-traumatic stress disorder, unspecified: Secondary | ICD-10-CM | POA: Diagnosis not present

## 2023-01-28 DIAGNOSIS — F4310 Post-traumatic stress disorder, unspecified: Secondary | ICD-10-CM | POA: Diagnosis not present

## 2023-02-10 DIAGNOSIS — F4310 Post-traumatic stress disorder, unspecified: Secondary | ICD-10-CM | POA: Diagnosis not present

## 2023-02-10 DIAGNOSIS — F1910 Other psychoactive substance abuse, uncomplicated: Secondary | ICD-10-CM | POA: Diagnosis not present

## 2023-02-10 DIAGNOSIS — F39 Unspecified mood [affective] disorder: Secondary | ICD-10-CM | POA: Diagnosis not present

## 2023-02-10 DIAGNOSIS — F1290 Cannabis use, unspecified, uncomplicated: Secondary | ICD-10-CM | POA: Diagnosis not present

## 2023-02-18 DIAGNOSIS — F4310 Post-traumatic stress disorder, unspecified: Secondary | ICD-10-CM | POA: Diagnosis not present

## 2023-03-04 DIAGNOSIS — F4310 Post-traumatic stress disorder, unspecified: Secondary | ICD-10-CM | POA: Diagnosis not present

## 2023-03-27 DIAGNOSIS — F1290 Cannabis use, unspecified, uncomplicated: Secondary | ICD-10-CM | POA: Diagnosis not present

## 2023-03-27 DIAGNOSIS — F39 Unspecified mood [affective] disorder: Secondary | ICD-10-CM | POA: Diagnosis not present

## 2023-03-27 DIAGNOSIS — F4310 Post-traumatic stress disorder, unspecified: Secondary | ICD-10-CM | POA: Diagnosis not present

## 2023-03-27 DIAGNOSIS — F1910 Other psychoactive substance abuse, uncomplicated: Secondary | ICD-10-CM | POA: Diagnosis not present

## 2023-04-08 DIAGNOSIS — F4310 Post-traumatic stress disorder, unspecified: Secondary | ICD-10-CM | POA: Diagnosis not present

## 2023-04-22 DIAGNOSIS — F1290 Cannabis use, unspecified, uncomplicated: Secondary | ICD-10-CM | POA: Diagnosis not present

## 2023-04-22 DIAGNOSIS — F1910 Other psychoactive substance abuse, uncomplicated: Secondary | ICD-10-CM | POA: Diagnosis not present

## 2023-04-22 DIAGNOSIS — F4310 Post-traumatic stress disorder, unspecified: Secondary | ICD-10-CM | POA: Diagnosis not present

## 2023-04-22 DIAGNOSIS — F39 Unspecified mood [affective] disorder: Secondary | ICD-10-CM | POA: Diagnosis not present

## 2023-05-06 DIAGNOSIS — F4310 Post-traumatic stress disorder, unspecified: Secondary | ICD-10-CM | POA: Diagnosis not present

## 2023-05-20 DIAGNOSIS — F1290 Cannabis use, unspecified, uncomplicated: Secondary | ICD-10-CM | POA: Diagnosis not present

## 2023-05-20 DIAGNOSIS — F39 Unspecified mood [affective] disorder: Secondary | ICD-10-CM | POA: Diagnosis not present

## 2023-05-20 DIAGNOSIS — F1910 Other psychoactive substance abuse, uncomplicated: Secondary | ICD-10-CM | POA: Diagnosis not present

## 2023-05-20 DIAGNOSIS — F4310 Post-traumatic stress disorder, unspecified: Secondary | ICD-10-CM | POA: Diagnosis not present

## 2023-06-17 DIAGNOSIS — F4310 Post-traumatic stress disorder, unspecified: Secondary | ICD-10-CM | POA: Diagnosis not present

## 2023-07-21 DIAGNOSIS — F4310 Post-traumatic stress disorder, unspecified: Secondary | ICD-10-CM | POA: Diagnosis not present

## 2023-07-21 DIAGNOSIS — F1910 Other psychoactive substance abuse, uncomplicated: Secondary | ICD-10-CM | POA: Diagnosis not present

## 2023-07-21 DIAGNOSIS — F1290 Cannabis use, unspecified, uncomplicated: Secondary | ICD-10-CM | POA: Diagnosis not present

## 2023-07-21 DIAGNOSIS — F39 Unspecified mood [affective] disorder: Secondary | ICD-10-CM | POA: Diagnosis not present

## 2023-07-22 DIAGNOSIS — F4310 Post-traumatic stress disorder, unspecified: Secondary | ICD-10-CM | POA: Diagnosis not present

## 2023-08-19 DIAGNOSIS — F4310 Post-traumatic stress disorder, unspecified: Secondary | ICD-10-CM | POA: Diagnosis not present

## 2023-09-22 DIAGNOSIS — F39 Unspecified mood [affective] disorder: Secondary | ICD-10-CM | POA: Diagnosis not present

## 2023-09-22 DIAGNOSIS — F4310 Post-traumatic stress disorder, unspecified: Secondary | ICD-10-CM | POA: Diagnosis not present

## 2023-09-22 DIAGNOSIS — F1290 Cannabis use, unspecified, uncomplicated: Secondary | ICD-10-CM | POA: Diagnosis not present

## 2023-09-22 DIAGNOSIS — F1910 Other psychoactive substance abuse, uncomplicated: Secondary | ICD-10-CM | POA: Diagnosis not present

## 2023-10-14 DIAGNOSIS — F4310 Post-traumatic stress disorder, unspecified: Secondary | ICD-10-CM | POA: Diagnosis not present

## 2023-11-11 DIAGNOSIS — F4310 Post-traumatic stress disorder, unspecified: Secondary | ICD-10-CM | POA: Diagnosis not present

## 2023-12-16 DIAGNOSIS — F4310 Post-traumatic stress disorder, unspecified: Secondary | ICD-10-CM | POA: Diagnosis not present

## 2023-12-18 DIAGNOSIS — F39 Unspecified mood [affective] disorder: Secondary | ICD-10-CM | POA: Diagnosis not present

## 2023-12-18 DIAGNOSIS — F1290 Cannabis use, unspecified, uncomplicated: Secondary | ICD-10-CM | POA: Diagnosis not present

## 2023-12-18 DIAGNOSIS — F4310 Post-traumatic stress disorder, unspecified: Secondary | ICD-10-CM | POA: Diagnosis not present

## 2023-12-18 DIAGNOSIS — F1910 Other psychoactive substance abuse, uncomplicated: Secondary | ICD-10-CM | POA: Diagnosis not present

## 2023-12-18 IMAGING — DX DG CHEST 2V
2 series · 2 of 2 positions shown · non-contrast
Comparison: 08/16/2012

CLINICAL DATA: 40-year-old male with preoperative right shoulder
chest x-ray

EXAM:
CHEST - 2 VIEW

[chest pa]
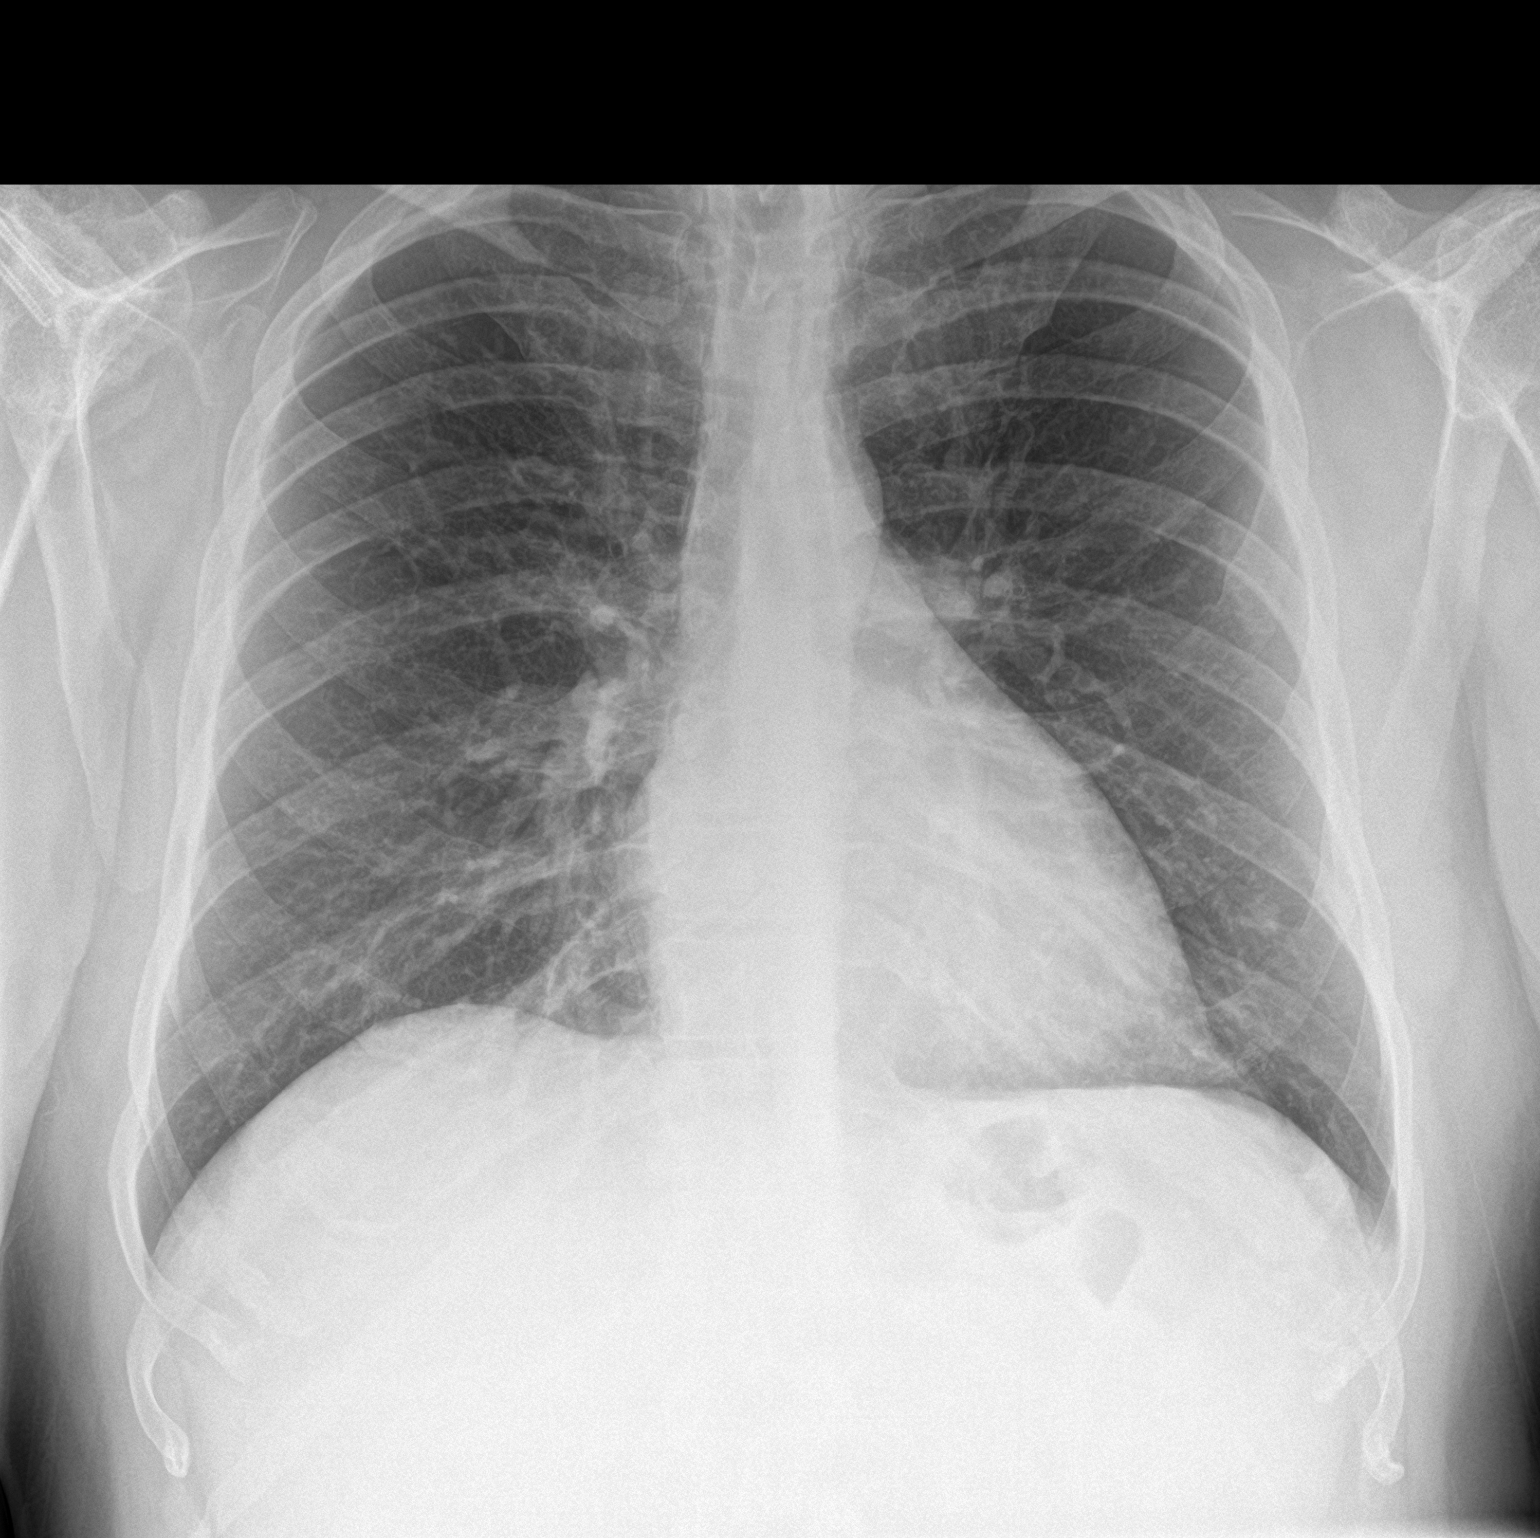

[chest lat]
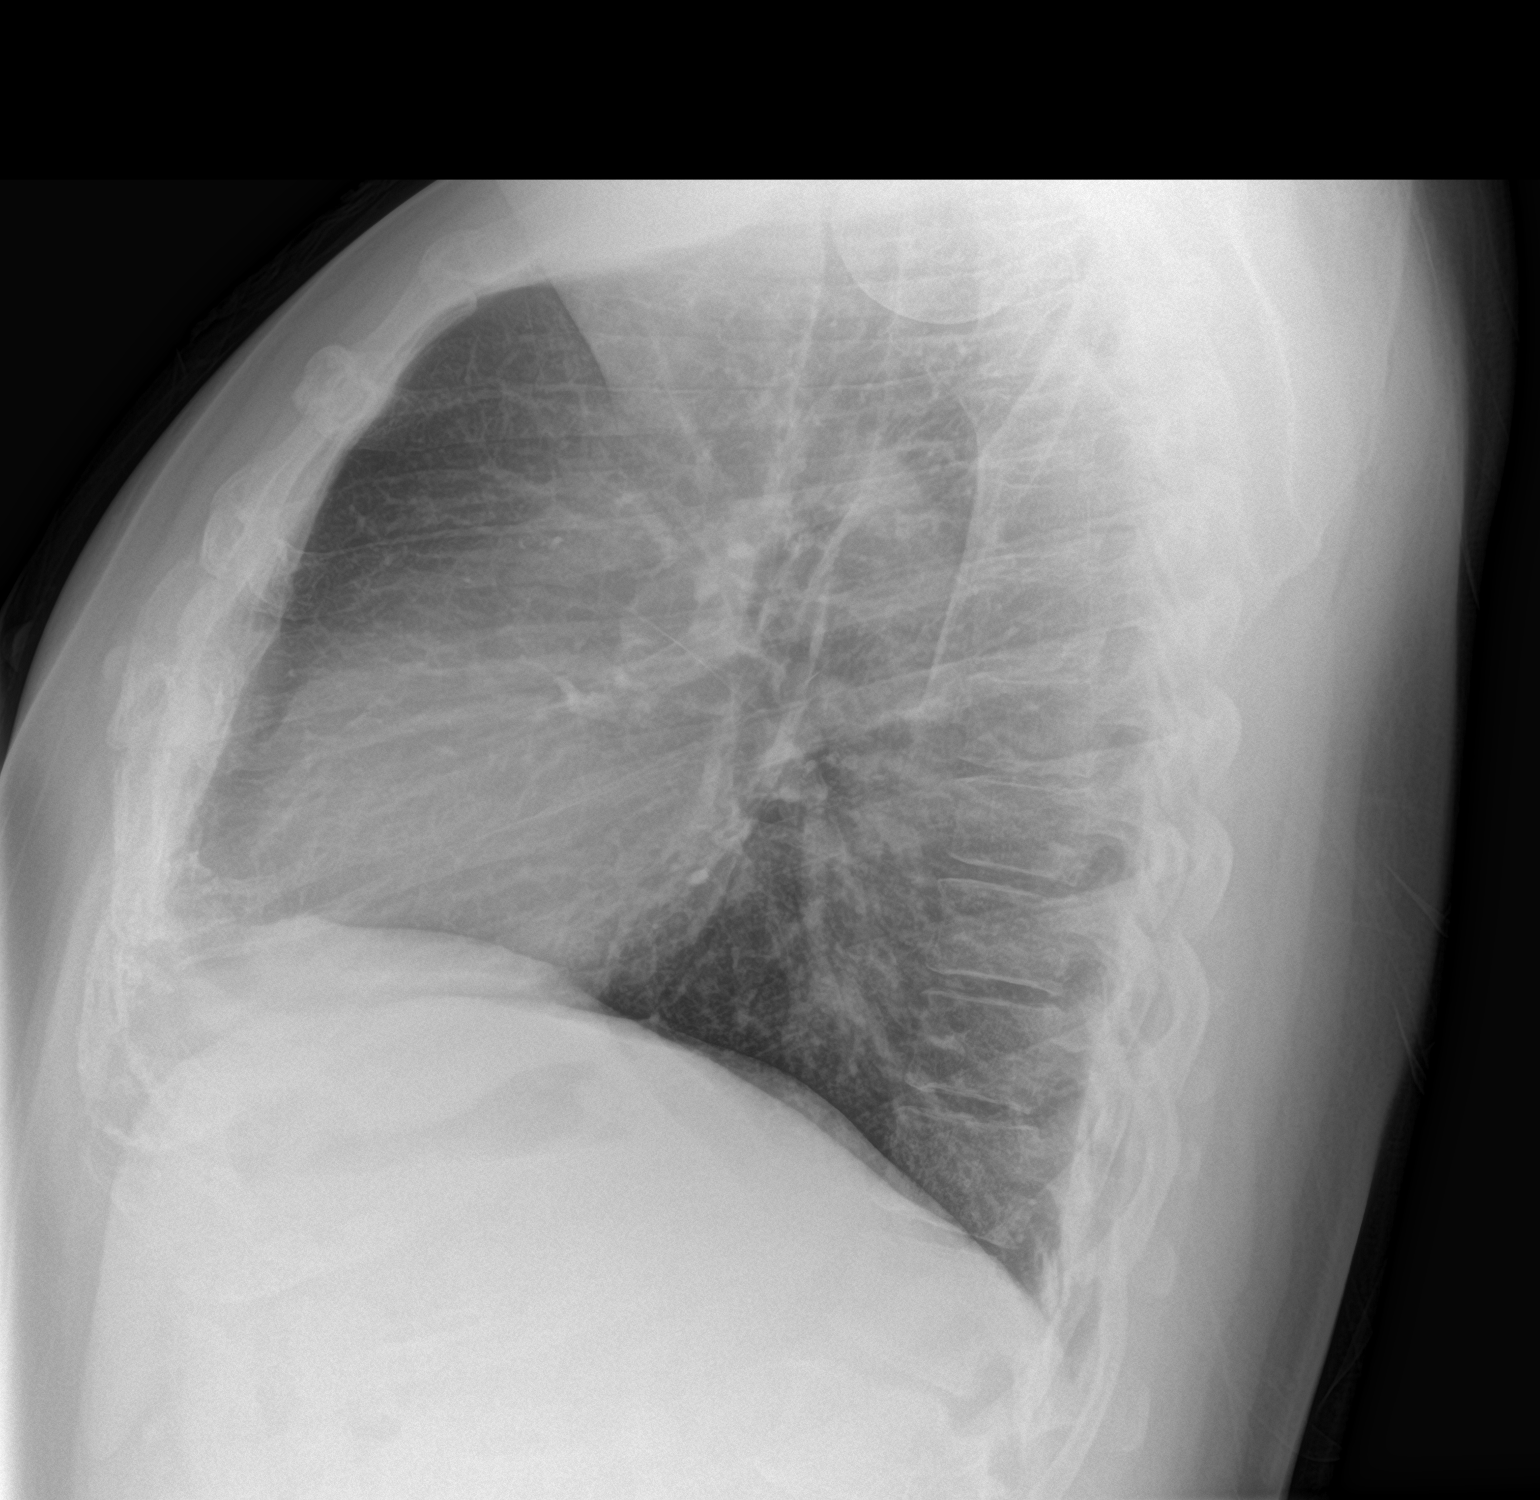

[2 of 2 positions shown; findings below may reference images not displayed]

FINDINGS: Cardiomediastinal silhouette unchanged in size and contour. No
evidence of central vascular congestion. No interlobular septal
thickening. No pneumothorax or pleural effusion. No confluent
airspace disease.

No acute displaced fracture

Incompletely imaged surgical changes of the right shoulder
IMPRESSION: Negative for acute cardiopulmonary disease

## 2024-01-20 DIAGNOSIS — F4310 Post-traumatic stress disorder, unspecified: Secondary | ICD-10-CM | POA: Diagnosis not present

## 2024-02-05 ENCOUNTER — Other Ambulatory Visit: Payer: Self-pay

## 2024-02-05 ENCOUNTER — Emergency Department (HOSPITAL_COMMUNITY)
Admission: EM | Admit: 2024-02-05 | Discharge: 2024-02-06 | Disposition: A | Discharge status: Other Acute Inpatient Hospital | Attending: Emergency Medicine | Admitting: Emergency Medicine

## 2024-02-05 ENCOUNTER — Emergency Department (HOSPITAL_COMMUNITY)

## 2024-02-05 ENCOUNTER — Encounter (HOSPITAL_COMMUNITY): Payer: Self-pay | Admitting: Emergency Medicine

## 2024-02-05 DIAGNOSIS — R112 Nausea with vomiting, unspecified: Secondary | ICD-10-CM | POA: Diagnosis not present

## 2024-02-05 DIAGNOSIS — R930 Abnormal findings on diagnostic imaging of skull and head, not elsewhere classified: Secondary | ICD-10-CM | POA: Diagnosis not present

## 2024-02-05 DIAGNOSIS — L03211 Cellulitis of face: Secondary | ICD-10-CM | POA: Diagnosis not present

## 2024-02-05 DIAGNOSIS — K047 Periapical abscess without sinus: Secondary | ICD-10-CM | POA: Insufficient documentation

## 2024-02-05 DIAGNOSIS — Z79899 Other long term (current) drug therapy: Secondary | ICD-10-CM | POA: Diagnosis not present

## 2024-02-05 DIAGNOSIS — L0201 Cutaneous abscess of face: Secondary | ICD-10-CM | POA: Diagnosis not present

## 2024-02-05 LAB — CBC WITH DIFFERENTIAL/PLATELET
Abs Immature Granulocytes: 0.09 K/uL — ABNORMAL HIGH (ref 0.00–0.07)
Basophils Absolute: 0.1 K/uL (ref 0.0–0.1)
Basophils Relative: 0 %
Eosinophils Absolute: 0.2 K/uL (ref 0.0–0.5)
Eosinophils Relative: 2 %
HCT: 48.7 % (ref 39.0–52.0)
Hemoglobin: 16 g/dL (ref 13.0–17.0)
Immature Granulocytes: 1 %
Lymphocytes Relative: 10 %
Lymphs Abs: 1.5 K/uL (ref 0.7–4.0)
MCH: 30.2 pg (ref 26.0–34.0)
MCHC: 32.9 g/dL (ref 30.0–36.0)
MCV: 92.1 fL (ref 80.0–100.0)
Monocytes Absolute: 1.2 K/uL — ABNORMAL HIGH (ref 0.1–1.0)
Monocytes Relative: 8 %
Neutro Abs: 11.8 K/uL — ABNORMAL HIGH (ref 1.7–7.7)
Neutrophils Relative %: 79 %
Platelets: 243 K/uL (ref 150–400)
RBC: 5.29 MIL/uL (ref 4.22–5.81)
RDW: 13.1 % (ref 11.5–15.5)
WBC: 14.9 K/uL — ABNORMAL HIGH (ref 4.0–10.5)
nRBC: 0 % (ref 0.0–0.2)

## 2024-02-05 LAB — COMPREHENSIVE METABOLIC PANEL WITH GFR
ALT: 33 U/L (ref 0–44)
AST: 19 U/L (ref 15–41)
Albumin: 3.4 g/dL — ABNORMAL LOW (ref 3.5–5.0)
Alkaline Phosphatase: 92 U/L (ref 38–126)
Anion gap: 13 (ref 5–15)
BUN: 12 mg/dL (ref 6–20)
CO2: 21 mmol/L — ABNORMAL LOW (ref 22–32)
Calcium: 8.9 mg/dL (ref 8.9–10.3)
Chloride: 102 mmol/L (ref 98–111)
Creatinine, Ser: 0.83 mg/dL (ref 0.61–1.24)
GFR, Estimated: >60 mL/min (ref >60)
Glucose, Bld: 110 mg/dL — ABNORMAL HIGH (ref 70–99)
Potassium: 3.6 mmol/L (ref 3.5–5.1)
Sodium: 136 mmol/L (ref 135–145)
Total Bilirubin: 0.9 mg/dL (ref 0.0–1.2)
Total Protein: 7.4 g/dL (ref 6.5–8.1)

## 2024-02-05 LAB — URINALYSIS, W/ REFLEX TO CULTURE (INFECTION SUSPECTED)
Bacteria, UA: NONE SEEN
Bilirubin Urine: NEGATIVE
Glucose, UA: NEGATIVE mg/dL
Hgb urine dipstick: NEGATIVE
Ketones, ur: NEGATIVE mg/dL
Leukocytes,Ua: NEGATIVE
Nitrite: NEGATIVE
Protein, ur: NEGATIVE mg/dL
Specific Gravity, Urine: 1.026 (ref 1.005–1.030)
pH: 5 (ref 5.0–8.0)

## 2024-02-05 LAB — I-STAT CG4 LACTIC ACID, ED
Lactic Acid, Venous: 1.1 mmol/L (ref 0.5–1.9)
Lactic Acid, Venous: 1 mmol/L (ref 0.5–1.9)

## 2024-02-05 MED ORDER — CLINDAMYCIN PHOSPHATE 600 MG/50ML IV SOLN
600.0000 mg | Freq: Four times a day (QID) | INTRAVENOUS | Status: DC
  Administered 2024-02-06: 600 mg via INTRAVENOUS
  Filled 2024-02-05 (×2): qty 50

## 2024-02-05 MED ORDER — HYDROMORPHONE HCL 1 MG/ML IJ SOLN
1.0000 mg | Freq: Once | INTRAMUSCULAR | Status: AC
  Administered 2024-02-05: 1 mg via INTRAVENOUS
  Filled 2024-02-05: qty 1

## 2024-02-05 MED ORDER — IOHEXOL 300 MG/ML  SOLN
100.0000 mL | Freq: Once | INTRAMUSCULAR | Status: AC | PRN
  Administered 2024-02-05: 100 mL via INTRAVENOUS

## 2024-02-05 MED ORDER — LACTATED RINGERS IV BOLUS
1000.0000 mL | Freq: Once | INTRAVENOUS | Status: AC
Start: 2024-02-05 — End: 2024-02-05
  Administered 2024-02-05: 1000 mL via INTRAVENOUS

## 2024-02-05 MED ORDER — KETOROLAC TROMETHAMINE 30 MG/ML IJ SOLN
30.0000 mg | Freq: Once | INTRAMUSCULAR | Status: AC
  Administered 2024-02-05: 30 mg via INTRAVENOUS
  Filled 2024-02-05: qty 1

## 2024-02-05 MED ORDER — BUTAMBEN-TETRACAINE-BENZOCAINE 2-2-14 % EX AERO
1.0000 | INHALATION_SPRAY | Freq: Once | CUTANEOUS | Status: AC
  Administered 2024-02-06: 1 via TOPICAL
  Filled 2024-02-05: qty 20

## 2024-02-05 MED ORDER — ACETAMINOPHEN 500 MG PO TABS
1000.0000 mg | ORAL_TABLET | Freq: Once | ORAL | Status: AC
  Administered 2024-02-05: 1000 mg via ORAL
  Filled 2024-02-05: qty 2

## 2024-02-05 MED ORDER — LACTATED RINGERS IV SOLN: INTRAVENOUS | Status: DC

## 2024-02-05 MED ORDER — LIDOCAINE HCL (PF) 1 % IJ SOLN
2.0000 mL | Freq: Once | INTRAMUSCULAR | Status: AC
  Administered 2024-02-06: 2 mL
  Filled 2024-02-05: qty 30

## 2024-02-05 MED ORDER — CLINDAMYCIN PHOSPHATE 600 MG/50ML IV SOLN
600.0000 mg | Freq: Once | INTRAVENOUS | Status: AC
  Administered 2024-02-05: 600 mg via INTRAVENOUS
  Filled 2024-02-05: qty 50

## 2024-02-05 NOTE — ED Provider Notes (Signed)
 [facility] EMERGENCY DEPARTMENT AT [facility] Provider Note   CSN: [id number] Arrival date & time: 02/05/24  1732     Patient presents with: Oral Swelling   Derek Mckinney is a 43 y.o. male.   HPI Patient reports that he got pain and swelling at his right upper canine 5 days ago.  He reports that he saw his dentist and got started on penicillin.  He reports he has been taking the antibiotic for 4 days but he has got more pain and swelling today and started getting nausea and vomiting.  Reports he has vomited multiple times today.  He has not had a fever at home that he is aware of.  He reports he has been off a lot of pain on the side of his face.  No other associated symptoms.  He denies abdominal pain.  He denies neck stiffness or headache.  No other significant medical problems.    Prior to Admission medications   Medication Sig Start Date End Date Taking? Authorizing Provider  acetaminophen  (TYLENOL ) 500 MG tablet Take 1,000 mg by mouth every 6 (six) hours as needed for moderate pain. pain   Yes [provider]  escitalopram  (LEXAPRO ) 10 MG tablet Take 1 tablet (10 mg total) by mouth daily. 12/20/21  Yes McElwee, Lauren A, NP  ibuprofen  (ADVIL ) 200 MG tablet Take 200 mg by mouth every 6 (six) hours as needed.   Yes [provider]  lamoTRIgine  (LAMICTAL ) 150 MG tablet Take 150 mg by mouth every evening. 11/22/23  Yes [provider]  ondansetron  (ZOFRAN ) 4 MG tablet Take 4 mg by mouth every 8 (eight) hours as needed for nausea or vomiting. 02/03/24  Yes [provider]  penicillin v potassium (VEETID) 500 MG tablet Take 500 mg by mouth 3 (three) times daily. 02/02/24  Yes [provider]  traZODone (DESYREL) 50 MG tablet Take 50 mg by mouth at bedtime as needed. 12/18/23  Yes [provider]    Allergies: Patient has no known allergies.    Review of Systems  Updated Vital Signs BP (!) 154/104 (BP Location: Left Arm)    Pulse 84   Temp (!) 100.6 F (38.1 C) (Oral)   Resp 16   SpO2 99%   Physical Exam Constitutional:      Comments: Alert clear mental status no respiratory distress.  HENT:     Head:     Comments: Patient has a large swelling of the right side of the face and the right upper lip.  This also extends slightly to the left side.  See attached images.  firm tender swelling of the right cheek and nasolabial fold    Mouth/Throat:     Comments: Decayed and partially fractured canine tooth on the right upper.  Posterior oropharynx widely patent. Eyes:     Extraocular Movements: Extraocular movements intact.     Conjunctiva/sclera: Conjunctivae normal.     Pupils: Pupils are equal, round, and reactive to light.  Cardiovascular:     Rate and Rhythm: Normal rate and regular rhythm.  Pulmonary:     Effort: Pulmonary effort is normal.     Breath sounds: Normal breath sounds.  Abdominal:     General: There is no distension.     Palpations: Abdomen is soft.     Tenderness: There is no abdominal tenderness.  Musculoskeletal:        General: No swelling or tenderness. Normal range of motion.     Cervical  back: Neck supple. No rigidity.     Right lower leg: No edema.     Left lower leg: No edema.  Lymphadenopathy:     Cervical: No cervical adenopathy.  Skin:    General: Skin is warm and dry.  Neurological:     General: No focal deficit present.     Mental Status: He is oriented to person, place, and time.     Motor: No weakness.     Coordination: Coordination normal.  Psychiatric:        Mood and Affect: Mood normal.        (all labs ordered are listed, but only abnormal results are displayed) Labs Reviewed  COMPREHENSIVE METABOLIC PANEL WITH GFR - Abnormal; Notable for the following components:      Result Value   CO2 21 (*)    Glucose, Bld 110 (*)    Albumin 3.4 (*)    All other components within normal limits  CBC WITH DIFFERENTIAL/PLATELET - Abnormal; Notable for the following  components:   WBC 14.9 (*)    Neutro Abs 11.8 (*)    Monocytes Absolute 1.2 (*)    Abs Immature Granulocytes 0.09 (*)    All other components within normal limits  URINALYSIS, W/ REFLEX TO CULTURE (INFECTION SUSPECTED)  I-STAT CG4 LACTIC ACID, ED  I-STAT CG4 LACTIC ACID, ED    EKG: None  Radiology: CT Maxillofacial W Contrast Result Date: 02/05/2024 EXAM: CT Face with contrast 02/05/2024 07:44:47 PM TECHNIQUE: CT of the face was performed with the administration of intravenous contrast. Multiplanar reformatted images are provided for review. Automated exposure control, iterative reconstruction, and/or weight based adjustment of the mA/kV was utilized to reduce the radiation dose to as low as reasonably achievable. COMPARISON: None available CLINICAL HISTORY: Facial dental abscess. Patient c/o dental abscess. Patient was seen by dentist and prescribed antibiotic last Tuesday. Patient reports more swelling and pain today. Dentist recommended root canal but next appointment will be by September. Patient reports nausea and vomiting x5 today. Patient denies fever at home. FINDINGS: AERODIGESTIVE TRACT: No mass. No edema. SALIVARY GLANDS: No acute abnormality. LYMPH NODES: No suspicious cervical lymphadenopathy. SOFT TISSUES: There is surrounding inflammatory induration of the right facial soft tissues with a heterogeneous appearance. BRAIN, ORBITS AND SINUSES: Mild bilateral maxillary sinus mucosal thickening. BONES: Apical lucency at the root of the right maxillary canine with an overlying subperiosteal abscess measuring approximately 10 x 8 x 28 mm. IMPRESSION: 1. Right maxillary canine apical lucency with overlying subperiosteal abscess measuring approximately 10 x 8 x 28 mm. 2. Surrounding inflammatory induration of the right facial soft tissues with a heterogeneous appearance. 3. Mild bilateral maxillary sinus mucosal thickening. Electronically signed by: Franky Stanford MD 02/05/2024 08:05 PM EDT RP  Workstation: [id number]     .Incision and Drainage  Date/Time: 02/05/2024 11:32 PM  Performed by: Armenta Canning, MD Authorized by: Armenta Canning, MD   Consent:    Consent obtained:  Verbal   Consent given by:  Patient   Risks discussed:  Bleeding, incomplete drainage, infection, damage to other organs and pain Universal protocol:    Patient identity confirmed:  Verbally with patient Location:    Type:  Abscess   Location:  Mouth   Mouth location:  Alveolar process Sedation:    Sedation type:  None Anesthesia:    Anesthesia method:  Topical application Procedure type:    Complexity:  Simple Procedure details:    Incision types:  Stab incision   Incision depth:  Submucosal   Drainage:  Purulent   Drainage amount:  Moderate   Wound treatment:  Wound left open Post-procedure details:    Procedure completion:  Tolerated well, no immediate complications Comments:     Patient did multiple water swishes and subsequently I used Cetacaine and 1% lidocaine  to anesthetize the inner buccal mucosa of the right cheek.  Patient had already small amount of active drainage.  Area is firm and tender.  Single straight stab incision with continued drainage and expressed moderate amount of purulent drainage.  Patient tolerated well.    Medications Ordered in the ED  lactated ringers  infusion ( Intravenous New Bag/Given 02/05/24 2204)  lidocaine  (PF) (XYLOCAINE ) 1 % injection 2 mL (has no administration in time range)  butamben-tetracaine-benzocaine (CETACAINE) spray 1 spray (has no administration in time range)  clindamycin  (CLEOCIN ) IVPB 600 mg (has no administration in time range)  HYDROmorphone  (DILAUDID ) injection 1 mg (1 mg Intravenous Given 02/05/24 1847)  lactated ringers  bolus 1,000 mL (0 mLs Intravenous Stopped 02/05/24 2001)  clindamycin  (CLEOCIN ) IVPB 600 mg (0 mg Intravenous Stopped 02/05/24 1941)  acetaminophen  (TYLENOL ) tablet 1,000 mg (1,000 mg Oral Given 02/05/24 2000)  iohexol   (OMNIPAQUE ) 300 MG/ML solution 100 mL (100 mLs Intravenous Contrast Given 02/05/24 1937)  HYDROmorphone  (DILAUDID ) injection 1 mg (1 mg Intravenous Given 02/05/24 2204)  ketorolac  (TORADOL ) 30 MG/ML injection 30 mg (30 mg Intravenous Given 02/05/24 2204)                                    Medical Decision Making Amount and/or Complexity of Data Reviewed Labs: ordered. Radiology: ordered.  Risk OTC drugs. Prescription drug management. Decision regarding hospitalization.   Patient presents as outlined.  He had a dental infection which has progressed despite 4 days of antibiotic outpatient therapy.  Patient is compliant with antibiotics.  Today he started having vomiting and cannot tolerate oral antibiotics.  Patient is nontoxic.  He does have temperature up to 100.6 in the emergency department.  Leukocytosis 14.9.  CT facial scan shows an apical lucency consistent with alveolar abscess and then induration of facial tissues consistent with cellulitis.  At this time with failed outpatient therapy will initiate clindamycin  and IV pain control.  I did perform a single incision into the area of most fluctuance and small amount of visible drainage in the buccal mucosa.  I expressed moderate amount of purulent drainage.  Patient tolerated well.  At this time with facial cellulitis and vomiting at home, will plan for admission with clindamycin  and IV hydration.  Patient does not have hypotension or tachycardia but has leukocytosis, source of infection and fever, will need hospital admission for treatment.     Final diagnoses:  Facial cellulitis  Dental abscess    ED Discharge Orders     None          Armenta Canning, MD 02/05/24 2338

## 2024-02-05 NOTE — ED Triage Notes (Signed)
 Patient c/oe dental abcess. Patient was seen by dentist and prescribe antibiotic last Tuesday. Patient report more swelling and pain today. Dentist recommended root canal but next appointment will be by September. Patient report nausea and vomiting x5 today. Patient denies fever at home.

## 2024-02-06 ENCOUNTER — Inpatient Hospital Stay (HOSPITAL_COMMUNITY)

## 2024-02-06 DIAGNOSIS — R519 Headache, unspecified: Secondary | ICD-10-CM | POA: Diagnosis not present

## 2024-02-06 DIAGNOSIS — R220 Localized swelling, mass and lump, head: Secondary | ICD-10-CM | POA: Diagnosis not present

## 2024-02-06 DIAGNOSIS — R609 Edema, unspecified: Secondary | ICD-10-CM | POA: Diagnosis not present

## 2024-02-06 DIAGNOSIS — Z7401 Bed confinement status: Secondary | ICD-10-CM | POA: Diagnosis not present

## 2024-02-06 DIAGNOSIS — K047 Periapical abscess without sinus: Secondary | ICD-10-CM | POA: Diagnosis present

## 2024-02-06 DIAGNOSIS — X58XXXA Exposure to other specified factors, initial encounter: Secondary | ICD-10-CM | POA: Diagnosis not present

## 2024-02-06 DIAGNOSIS — S0240CA Maxillary fracture, right side, initial encounter for closed fracture: Secondary | ICD-10-CM | POA: Diagnosis not present

## 2024-02-06 DIAGNOSIS — I10 Essential (primary) hypertension: Secondary | ICD-10-CM | POA: Diagnosis not present

## 2024-02-06 LAB — BASIC METABOLIC PANEL WITH GFR
Anion gap: 12 (ref 5–15)
BUN: 11 mg/dL (ref 6–20)
CO2: 25 mmol/L (ref 22–32)
Calcium: 8.9 mg/dL (ref 8.9–10.3)
Chloride: 103 mmol/L (ref 98–111)
Creatinine, Ser: 0.79 mg/dL (ref 0.61–1.24)
GFR, Estimated: >60 mL/min (ref >60)
Glucose, Bld: 119 mg/dL — ABNORMAL HIGH (ref 70–99)
Potassium: 3.9 mmol/L (ref 3.5–5.1)
Sodium: 140 mmol/L (ref 135–145)

## 2024-02-06 LAB — PHOSPHORUS: Phosphorus: 2.3 mg/dL — ABNORMAL LOW (ref 2.5–4.6)

## 2024-02-06 LAB — CBC
HCT: 46.4 % (ref 39.0–52.0)
Hemoglobin: 15.4 g/dL (ref 13.0–17.0)
MCH: 30.7 pg (ref 26.0–34.0)
MCHC: 33.2 g/dL (ref 30.0–36.0)
MCV: 92.6 fL (ref 80.0–100.0)
Platelets: 247 K/uL (ref 150–400)
RBC: 5.01 MIL/uL (ref 4.22–5.81)
RDW: 13 % (ref 11.5–15.5)
WBC: 12.2 K/uL — ABNORMAL HIGH (ref 4.0–10.5)
nRBC: 0 % (ref 0.0–0.2)

## 2024-02-06 LAB — MAGNESIUM: Magnesium: 1.8 mg/dL (ref 1.7–2.4)

## 2024-02-06 MED ORDER — OXYCODONE HCL 5 MG PO TABS: 5.0000 mg | ORAL_TABLET | ORAL | Status: DC | PRN

## 2024-02-06 MED ORDER — PROCHLORPERAZINE EDISYLATE 10 MG/2ML IJ SOLN: 10.0000 mg | Freq: Four times a day (QID) | INTRAMUSCULAR | Status: DC | PRN

## 2024-02-06 MED ORDER — ESCITALOPRAM OXALATE 10 MG PO TABS
10.0000 mg | ORAL_TABLET | Freq: Every day | ORAL | Status: DC
  Administered 2024-02-06: 10 mg via ORAL
  Filled 2024-02-06: qty 1

## 2024-02-06 MED ORDER — ONDANSETRON HCL 4 MG/2ML IJ SOLN
4.0000 mg | Freq: Four times a day (QID) | INTRAMUSCULAR | Status: DC | PRN
  Administered 2024-02-06: 4 mg via INTRAVENOUS
  Filled 2024-02-06: qty 2

## 2024-02-06 MED ORDER — ALBUTEROL SULFATE (2.5 MG/3ML) 0.083% IN NEBU: 2.5000 mg | INHALATION_SOLUTION | RESPIRATORY_TRACT | Status: DC | PRN

## 2024-02-06 MED ORDER — TRAZODONE HCL 100 MG PO TABS: 50.0000 mg | ORAL_TABLET | Freq: Every evening | ORAL | Status: DC | PRN

## 2024-02-06 MED ORDER — LORAZEPAM 2 MG/ML IJ SOLN
1.0000 mg | Freq: Once | INTRAMUSCULAR | Status: AC | PRN
Start: 2024-02-06 — End: 2024-02-06
  Administered 2024-02-06: 1 mg via INTRAVENOUS
  Filled 2024-02-06: qty 1

## 2024-02-06 MED ORDER — OXYCODONE HCL 5 MG PO TABS: 2.5000 mg | ORAL_TABLET | ORAL | Status: DC | PRN

## 2024-02-06 MED ORDER — SODIUM CHLORIDE 0.9 % IV SOLN: INTRAVENOUS | Status: DC

## 2024-02-06 MED ORDER — VANCOMYCIN HCL 1750 MG/350ML IV SOLN: 1750.0000 mg | Freq: Two times a day (BID) | INTRAVENOUS | Status: DC

## 2024-02-06 MED ORDER — SODIUM CHLORIDE 0.9% FLUSH
3.0000 mL | Freq: Two times a day (BID) | INTRAVENOUS | Status: DC
  Administered 2024-02-06: 3 mL via INTRAVENOUS

## 2024-02-06 MED ORDER — MELATONIN 3 MG PO TABS: 6.0000 mg | ORAL_TABLET | Freq: Every evening | ORAL | Status: DC | PRN

## 2024-02-06 MED ORDER — LAMOTRIGINE 25 MG PO TABS: 150.0000 mg | ORAL_TABLET | Freq: Every evening | ORAL | Status: DC

## 2024-02-06 MED ORDER — POLYETHYLENE GLYCOL 3350 17 G PO PACK: 17.0000 g | PACK | Freq: Every day | ORAL | Status: DC | PRN

## 2024-02-06 MED ORDER — GADOBUTROL 1 MMOL/ML IV SOLN
10.0000 mL | Freq: Once | INTRAVENOUS | Status: AC | PRN
  Administered 2024-02-06: 10 mL via INTRAVENOUS

## 2024-02-06 MED ORDER — ACETAMINOPHEN 500 MG PO TABS: 1000.0000 mg | ORAL_TABLET | Freq: Four times a day (QID) | ORAL | Status: DC | PRN

## 2024-02-06 MED ORDER — VANCOMYCIN HCL 2000 MG/400ML IV SOLN
2000.0000 mg | Freq: Once | INTRAVENOUS | Status: AC
  Administered 2024-02-06: 2000 mg via INTRAVENOUS
  Filled 2024-02-06: qty 400

## 2024-02-06 MED ORDER — HYDROMORPHONE HCL 1 MG/ML IJ SOLN
0.5000 mg | INTRAMUSCULAR | Status: DC | PRN
  Administered 2024-02-06: 0.5 mg via INTRAVENOUS
  Filled 2024-02-06: qty 1

## 2024-02-06 MED ORDER — SODIUM CHLORIDE 0.9 % IV SOLN
3.0000 g | Freq: Four times a day (QID) | INTRAVENOUS | Status: DC
  Administered 2024-02-06 (×2): 3 g via INTRAVENOUS
  Filled 2024-02-06 (×2): qty 8

## 2024-02-06 MED ORDER — VANCOMYCIN HCL IN DEXTROSE 1-5 GM/200ML-% IV SOLN: 1000.0000 mg | Freq: Once | INTRAVENOUS | Status: DC

## 2024-02-06 NOTE — ED Notes (Signed)
 [facility] called for ED to ED transport

## 2024-02-06 NOTE — ED Notes (Signed)
[facility]ALS line contacted

## 2024-02-06 NOTE — ED Provider Notes (Signed)
 Care of patient received from prior provider at 3:13 AM, please see their note for complete H/P and care plan.  Received handoff per ED course.  Clinical Course as of 02/06/24 0313  Sat Feb 06, 2024  0016 Apical abscess and facial swelling [CC]    Clinical Course User Index [CC] Jerral Meth, MD   CRITICAL CARE Performed by: Meth Jerral   Total critical care time: 45 minutes  Critical care time was exclusive of separately billable procedures and treating other patients.  Critical care was necessary to treat or prevent imminent or life-threatening deterioration.  Critical care was time spent personally by me on the following activities: development of treatment plan with patient and/or surrogate as well as nursing, discussions with consultants, evaluation of patient's response to treatment, examination of patient, obtaining history from patient or surrogate, ordering and performing treatments and interventions, ordering and review of laboratory studies, ordering and review of radiographic studies, pulse oximetry and re-evaluation of patient's condition.   Reassessment: Care of patient received from prior provider.  Consulted medicine for admission.  He continued to have severe pain requiring serial doses of IV narcotics to control his symptoms.  He required close hemodynamic monitoring in this context.  Observed overnight pending admission to the hospitalist service.     Jerral Meth, MD 02/06/24 [phone]

## 2024-02-06 NOTE — ED Provider Notes (Addendum)
  Physical Exam  BP (!) 167/103 (BP Location: Left Arm)   Pulse 85   Temp 98.6 F (37 C) (Oral)   Resp 17   Ht 6' 2 (1.88 m)   Wt 117.9 kg   SpO2 97%   BMI 33.38 kg/m   Physical Exam  Procedures  Procedures  ED Course / MDM   Clinical Course as of 02/06/24 0707  Sat Feb 06, 2024  0016 Apical abscess and facial swelling [CC]    Clinical Course User Index [CC] Jerral Meth, MD   Medical Decision Making Amount and/or Complexity of Data Reviewed Labs: ordered. Radiology: ordered.  Risk OTC drugs. Prescription drug management. Decision regarding hospitalization.   Patient had been put in for admission around 7 hours ago.  However patient just seen by hospitalist and request MR V of the head to rule out central venous sinus thrombosis.  However he also states that we will need transfer to [facility] because no oral maxillofacial coverage here.  Prior provider had discussed with Dr. Soldatova from ENT who stated that they needed oral maxillofacial coverage  MRI does not show central venous sinus thrombosis.  Will work on transfer.  Discussed with ENT at [facility].  She request to talk to our ENT on-call.  Discussed again with Dr. Marolyn from ENT.  She had discussed with Dr.Soldatova.  Dr. Marolyn stated that she was told that it is against our policy to have ENT manage these.  However she did except in transfer to ER to ER.   Reviewing notes I may have been wrong with the doctor's name.  May have been Dr. Delayne, and not Marolyn River, Rankin, MD 02/06/24 9076    River Rankin, MD 02/06/24 1036

## 2024-02-06 NOTE — ED Notes (Signed)
 Report given to [facility] ED charge nurse.

## 2024-02-06 NOTE — Consult Note (Signed)
 Initial Consultation Note   Patient: Derek Mckinney FMW:[id number] DOB: March 21, 1981 PCP: Nedra Tinnie LABOR, NP DOA: 02/05/2024 DOS: the patient was seen and examined on 02/06/2024 Primary service: Keturah Carrier, MD  Referring physician: Keturah Carrier, MD Reason for consult: Dental abscess / facial cellulitis   Assessment/Plan: Assessment and Plan:  43 y/o M with no related hx who had dental pain and saw dentist and diagnosed with dental abscess within past week, Rxd for Penicillin but symptoms continued worsening with new onset of R facial swelling. Progressing to R sided headache, and acute N/V. Found to have R maxillary canine periapical abscess measuring up to 2.8 cm, with associated facial cellulitis and developing preseptal cellulitis on the right. Also concern with his HA and n/v, fever and left periorbital swelling despite no significant infection on that side that could have process such as cavernous sinus thrombosis and will need MRI to rule out. EDP performed an incision and drainage of buccal surface with purulent output, no culture taken. Offgoing EDP Pfeiffer had assured that there would be OMFS coverage and initially planned for admission. However on review there is no OMFS coverage until 8/12. I spoke with ENT Dr. Soldatova she agrees will require OMFS. I have cancelled his admission and advised EDP to transfer to outside facility with OMFS coverage. Will need MRV prior to transfer.   Periapical abscess, R maxillary canine, 2.8 cm  Associated facial cellulitis, developing R sided preseptal cellulitis  -- MR/MRV head w w/o to r/o cavernous sinus thrombosis  -- s/p Clindamycin , changed to Vancomycin  pharmacy to dose, Unasyn  3 g IV q 6 hr  -- Serial exam, if concern for developing orbital cellulitis will need reimaging and surgical evaluation  -- Advised to transfer to outside facility after MR result for definitive treatment of underlying periapical abscess.   N/V  -- Ns at 100  cc/hr  -- zofran  / compazine  prn   Facial pain  -- Tylenol  prn mild, oxycodone  2.5 / 5 mg q 4 hr prn for mod / severe, dilaudid  0.5 mg IV q 4 hr prn for breakthrough   Chronic medical problems:  Mood d/o: Continue home escitalopram  and Lamotrigine    Dispo: Admit declined. Advised to transfer to outside facility with OMFS coverage    TRH will sign off at present, please call us  again when needed.  =======================================================   CC: Facial infection / N/V   HPI: Derek Mckinney is a 43 y.o. male with no related hx who had dental pain and saw dentist and diagnosed with dental abscess within past week, Rxd for Penicillin but symptoms continued worsening with new onset of R facial swelling. Progressing to R sided headache, and acute N/V. Intolerance of PO over past day. No pain with EOM. No loss of visual acuity. No fever prior to coming in to ED.   Review of Systems: As mentioned in the history of present illness. All other systems reviewed and are negative. Past Medical History:  Diagnosis Date   Anxiety    Chronic insomnia    Depression    GERD (gastroesophageal reflux disease)    Gout    Headache(784.0)    Hyperlipidemia    Hypertension    borderline   Obesity    Polysubstance abuse (HCC)    Seizures (HCC)    Past Surgical History:  Procedure Laterality Date   BANKART REPAIR  07/22/2011   Procedure: OPEN MANUEL REPAIR;  Surgeon: Eva Elsie Herring, MD;  Location: [facility];  Service:  Orthopedics;  Laterality: Right;  right shoulder hardware removal, bone grafting of humeral head defect, open bankhardt repair C ARM   FRACTURE SURGERY     HARDWARE REMOVAL  07/22/2011   Procedure: HARDWARE REMOVAL;  Surgeon: Eva Elsie Herring, MD;  Location: [facility];  Service: Orthopedics;  Laterality: Right;   REVERSE SHOULDER ARTHROPLASTY Right 11/21/2021   Procedure: REVERSE SHOULDER ARTHROPLASTY WITH HARDWARE  REMOVAL;  Surgeon: Herring Eva, MD;  Location: [facility];  Service: Orthopedics;  Laterality: Right;   right shoulder surgery     2012   widom teeth extraction      as teenager   Social History:  reports that he has never smoked. His smokeless tobacco use includes chew. He reports current drug use. Drug: Marijuana. He reports that he does not drink alcohol.  No Known Allergies  Family History  Problem Relation Age of Onset   Hypothyroidism Mother    Depression Mother    Physical abuse Mother    Sexual abuse Mother    Gout Father    Lupus Father    Hyperlipidemia Father    Alcohol abuse Father    Drug abuse Father    Cancer Maternal Grandmother        lung cancer   Physical abuse Brother    Sexual abuse Brother     Prior to Admission medications   Medication Sig Start Date End Date Taking? Authorizing Provider  acetaminophen  (TYLENOL ) 500 MG tablet Take 1,000 mg by mouth every 6 (six) hours as needed for moderate pain. pain   Yes [provider]  escitalopram  (LEXAPRO ) 10 MG tablet Take 1 tablet (10 mg total) by mouth daily. 12/20/21  Yes McElwee, Lauren A, NP  ibuprofen  (ADVIL ) 200 MG tablet Take 200 mg by mouth every 6 (six) hours as needed.   Yes [provider]  lamoTRIgine  (LAMICTAL ) 150 MG tablet Take 150 mg by mouth every evening. 11/22/23  Yes [provider]  ondansetron  (ZOFRAN ) 4 MG tablet Take 4 mg by mouth every 8 (eight) hours as needed for nausea or vomiting. 02/03/24  Yes [provider]  penicillin v potassium (VEETID) 500 MG tablet Take 500 mg by mouth 3 (three) times daily. 02/02/24  Yes [provider]  traZODone  (DESYREL ) 50 MG tablet Take 50 mg by mouth at bedtime as needed. 12/18/23  Yes [provider]    Physical Exam: Vitals:   02/05/24 2303 02/06/24 0010 02/06/24 0620 02/06/24 0639  BP:  (!) 167/103    Pulse:  85    Resp:  17    Temp: (!) 100.6 F (38.1 C) 99.9 F (37.7 C) 98.6 F (37 C)    TempSrc: Oral Oral Oral   SpO2:  97%    Weight:    117.9 kg  Height:    6' 2 (1.88 m)    Gen: Awake, alert, NAD  HEENT: there is induration and asymmetric facial swelling centered over the upper R maxilla. Overlying erythema and swelling also extends to include the R lower eyelid. There is bilateral periorbital edema. No chemosis. EOM are intact and painless. PERRL. Along the buccal surface there is draining gross purulence.  CV: Regular, normal S1, S2, no murmurs  Resp: Normal WOB, CTAB  Abd: Flat, normoactive, nontender MSK: Symmetric, no edema  Skin: No rashes or lesions to exposed skin  Neuro: Alert and interactive  Psych: euthymic, appropriate    Data Reviewed:   Lactate 1.1  Bicarb 21  WBC 14  CT max face reviewed  CT Maxillofacial W Contrast Result Date: 02/05/2024 EXAM: CT Face with contrast 02/05/2024 07:44:47 PM TECHNIQUE: CT of the face was performed with the administration of intravenous contrast. Multiplanar reformatted images are provided for review. Automated exposure control, iterative reconstruction, and/or weight based adjustment of the mA/kV was utilized to reduce the radiation dose to as low as reasonably achievable. COMPARISON: None available CLINICAL HISTORY: Facial dental abscess. Patient c/o dental abscess. Patient was seen by dentist and prescribed antibiotic last Tuesday. Patient reports more swelling and pain today. Dentist recommended root canal but next appointment will be by September. Patient reports nausea and vomiting x5 today. Patient denies fever at home. FINDINGS: AERODIGESTIVE TRACT: No mass. No edema. SALIVARY GLANDS: No acute abnormality. LYMPH NODES: No suspicious cervical lymphadenopathy. SOFT TISSUES: There is surrounding inflammatory induration of the right facial soft tissues with a heterogeneous appearance. BRAIN, ORBITS AND SINUSES: Mild bilateral maxillary sinus mucosal thickening. BONES: Apical lucency at the root of the right maxillary  canine with an overlying subperiosteal abscess measuring approximately 10 x 8 x 28 mm. IMPRESSION: 1. Right maxillary canine apical lucency with overlying subperiosteal abscess measuring approximately 10 x 8 x 28 mm. 2. Surrounding inflammatory induration of the right facial soft tissues with a heterogeneous appearance. 3. Mild bilateral maxillary sinus mucosal thickening. Electronically signed by: Franky Stanford MD 02/05/2024 08:05 PM EDT RP Workstation: [id number]    Family Communication: None  Primary team communication: Yes discussed with EDP  Thank you very much for involving us  in the care of your patient.  Author: Dorn Dawson, MD 02/06/2024 6:48 AM  For on call review [email or web address].

## 2024-02-06 NOTE — Progress Notes (Signed)
 Pharmacy Antibiotic Note  Derek Mckinney is a 43 y.o. male admitted on 02/05/2024 with facial cellulitis due to progression of dental infection.  Pharmacy has been consulted for Vancomycin  dosing.  Plan: Vancomycin  2gm IV x 1 now followed by Vancomycin  1750 mg IV Q 12 hrs. Goal AUC 400-550. Expected AUC: 515.6  SCr used: 0.83 Unasyn  per MD Follow renal function    Temp (24hrs), Avg:99.6 F (37.6 C), Min:98.4 F (36.9 C), Max:100.7 F (38.2 C)  Recent Labs  Lab 02/05/24 1755 02/05/24 2247  WBC 14.9*  --   CREATININE 0.83  --   LATICACIDVEN 1.1 1.0    CrCl cannot be calculated (Unknown ideal weight.).    No Known Allergies  Antimicrobials this admission: 8/1 clindamycin  >> 8/2 8/2 Unasyn  >>   8/2 Vancomycin  >>    Thank you for allowing pharmacy to be a part of this patient's care.  Kemp Arvin Fletcher, PharmD 02/06/2024 6:33 AM

## 2024-02-07 DIAGNOSIS — K047 Periapical abscess without sinus: Secondary | ICD-10-CM | POA: Diagnosis not present

## 2024-02-07 DIAGNOSIS — Z136 Encounter for screening for cardiovascular disorders: Secondary | ICD-10-CM | POA: Diagnosis not present

## 2024-02-07 DIAGNOSIS — R220 Localized swelling, mass and lump, head: Secondary | ICD-10-CM | POA: Diagnosis not present

## 2024-02-17 DIAGNOSIS — F4310 Post-traumatic stress disorder, unspecified: Secondary | ICD-10-CM | POA: Diagnosis not present

## 2024-02-29 DIAGNOSIS — F4310 Post-traumatic stress disorder, unspecified: Secondary | ICD-10-CM | POA: Diagnosis not present

## 2024-02-29 DIAGNOSIS — F1910 Other psychoactive substance abuse, uncomplicated: Secondary | ICD-10-CM | POA: Diagnosis not present

## 2024-02-29 DIAGNOSIS — F1290 Cannabis use, unspecified, uncomplicated: Secondary | ICD-10-CM | POA: Diagnosis not present

## 2024-02-29 DIAGNOSIS — F39 Unspecified mood [affective] disorder: Secondary | ICD-10-CM | POA: Diagnosis not present

## 2024-03-04 ENCOUNTER — Encounter: Payer: Self-pay | Admitting: Nurse Practitioner

## 2024-04-06 DIAGNOSIS — F4310 Post-traumatic stress disorder, unspecified: Secondary | ICD-10-CM | POA: Diagnosis not present

## 2024-05-04 DIAGNOSIS — F4310 Post-traumatic stress disorder, unspecified: Secondary | ICD-10-CM | POA: Diagnosis not present

## 2024-05-18 DIAGNOSIS — F4310 Post-traumatic stress disorder, unspecified: Secondary | ICD-10-CM | POA: Diagnosis not present

## 2024-05-20 DIAGNOSIS — F4310 Post-traumatic stress disorder, unspecified: Secondary | ICD-10-CM | POA: Diagnosis not present

## 2024-05-20 DIAGNOSIS — F1910 Other psychoactive substance abuse, uncomplicated: Secondary | ICD-10-CM | POA: Diagnosis not present

## 2024-05-20 DIAGNOSIS — F1290 Cannabis use, unspecified, uncomplicated: Secondary | ICD-10-CM | POA: Diagnosis not present

## 2024-05-20 DIAGNOSIS — F39 Unspecified mood [affective] disorder: Secondary | ICD-10-CM | POA: Diagnosis not present

## 2024-06-08 DIAGNOSIS — F4310 Post-traumatic stress disorder, unspecified: Secondary | ICD-10-CM | POA: Diagnosis not present

## 2024-06-14 DIAGNOSIS — F4310 Post-traumatic stress disorder, unspecified: Secondary | ICD-10-CM | POA: Diagnosis not present

## 2024-06-14 DIAGNOSIS — F1910 Other psychoactive substance abuse, uncomplicated: Secondary | ICD-10-CM | POA: Diagnosis not present

## 2024-06-14 DIAGNOSIS — F39 Unspecified mood [affective] disorder: Secondary | ICD-10-CM | POA: Diagnosis not present

## 2024-06-14 DIAGNOSIS — F1290 Cannabis use, unspecified, uncomplicated: Secondary | ICD-10-CM | POA: Diagnosis not present

## 2024-06-22 DIAGNOSIS — F4310 Post-traumatic stress disorder, unspecified: Secondary | ICD-10-CM | POA: Diagnosis not present
